# Patient Record
Sex: Male | Born: 1980 | Race: White | Hispanic: No | Marital: Single | Smoking: Current every day smoker
Health system: Southern US, Community
[De-identification: ages and names within clinical notes are randomized; demographics above are authoritative.]

## PROBLEM LIST (undated history)

## (undated) DIAGNOSIS — Z86010 Personal history of colonic polyps: Secondary | ICD-10-CM

## (undated) DIAGNOSIS — I10 Essential (primary) hypertension: Secondary | ICD-10-CM

## (undated) DIAGNOSIS — F419 Anxiety disorder, unspecified: Secondary | ICD-10-CM

## (undated) DIAGNOSIS — F4310 Post-traumatic stress disorder, unspecified: Secondary | ICD-10-CM

## (undated) HISTORY — PX: COLONOSCOPY W/ BIOPSIES AND POLYPECTOMY: SHX1376

## (undated) HISTORY — PX: MYRINGOTOMY: SHX2060

---

## 2003-01-05 ENCOUNTER — Emergency Department (HOSPITAL_COMMUNITY): Admission: EM | Admit: 2003-01-05 | Discharge: 2003-01-05 | Payer: Self-pay | Admitting: Emergency Medicine

## 2005-07-23 ENCOUNTER — Emergency Department (HOSPITAL_COMMUNITY): Admission: EM | Admit: 2005-07-23 | Discharge: 2005-07-23 | Payer: Self-pay | Admitting: Emergency Medicine

## 2005-09-30 ENCOUNTER — Emergency Department (HOSPITAL_COMMUNITY): Admission: EM | Admit: 2005-09-30 | Discharge: 2005-09-30 | Payer: Self-pay | Admitting: Emergency Medicine

## 2006-05-13 ENCOUNTER — Emergency Department: Payer: Self-pay | Admitting: Emergency Medicine

## 2008-04-21 ENCOUNTER — Emergency Department (HOSPITAL_COMMUNITY): Admission: EM | Admit: 2008-04-21 | Discharge: 2008-04-21 | Payer: Self-pay | Admitting: Emergency Medicine

## 2008-04-23 ENCOUNTER — Emergency Department (HOSPITAL_COMMUNITY): Admission: EM | Admit: 2008-04-23 | Discharge: 2008-04-23 | Payer: Self-pay | Admitting: Emergency Medicine

## 2011-09-02 ENCOUNTER — Emergency Department (HOSPITAL_COMMUNITY)
Admission: EM | Admit: 2011-09-02 | Discharge: 2011-09-02 | Disposition: A | Discharge status: Home / Self Care | Payer: Self-pay | Attending: Emergency Medicine | Admitting: Emergency Medicine

## 2011-09-02 DIAGNOSIS — Z79899 Other long term (current) drug therapy: Secondary | ICD-10-CM | POA: Insufficient documentation

## 2011-09-02 DIAGNOSIS — F3289 Other specified depressive episodes: Secondary | ICD-10-CM | POA: Insufficient documentation

## 2011-09-02 DIAGNOSIS — F329 Major depressive disorder, single episode, unspecified: Secondary | ICD-10-CM | POA: Insufficient documentation

## 2011-09-02 DIAGNOSIS — Y93H9 Activity, other involving exterior property and land maintenance, building and construction: Secondary | ICD-10-CM | POA: Insufficient documentation

## 2011-09-02 DIAGNOSIS — S0990XA Unspecified injury of head, initial encounter: Secondary | ICD-10-CM | POA: Insufficient documentation

## 2011-09-02 DIAGNOSIS — S0180XA Unspecified open wound of other part of head, initial encounter: Secondary | ICD-10-CM | POA: Insufficient documentation

## 2011-09-02 DIAGNOSIS — R51 Headache: Secondary | ICD-10-CM | POA: Insufficient documentation

## 2011-09-02 DIAGNOSIS — IMO0002 Reserved for concepts with insufficient information to code with codable children: Secondary | ICD-10-CM | POA: Insufficient documentation

## 2011-11-11 ENCOUNTER — Encounter: Payer: Self-pay | Admitting: Emergency Medicine

## 2011-11-11 ENCOUNTER — Inpatient Hospital Stay (HOSPITAL_COMMUNITY)
Admission: EM | Admit: 2011-11-11 | Discharge: 2011-11-15 | DRG: 439 | Disposition: A | Discharge status: Home / Self Care | Payer: Non-veteran care | Attending: Internal Medicine | Admitting: Internal Medicine

## 2011-11-11 ENCOUNTER — Emergency Department (HOSPITAL_COMMUNITY): Payer: Non-veteran care

## 2011-11-11 DIAGNOSIS — R7401 Elevation of levels of liver transaminase levels: Secondary | ICD-10-CM | POA: Diagnosis present

## 2011-11-11 DIAGNOSIS — I10 Essential (primary) hypertension: Secondary | ICD-10-CM | POA: Diagnosis present

## 2011-11-11 DIAGNOSIS — K859 Acute pancreatitis without necrosis or infection, unspecified: Principal | ICD-10-CM | POA: Diagnosis present

## 2011-11-11 DIAGNOSIS — F1010 Alcohol abuse, uncomplicated: Secondary | ICD-10-CM | POA: Diagnosis present

## 2011-11-11 DIAGNOSIS — F10939 Alcohol use, unspecified with withdrawal, unspecified: Secondary | ICD-10-CM | POA: Diagnosis present

## 2011-11-11 DIAGNOSIS — F17200 Nicotine dependence, unspecified, uncomplicated: Secondary | ICD-10-CM | POA: Diagnosis present

## 2011-11-11 DIAGNOSIS — E781 Pure hyperglyceridemia: Secondary | ICD-10-CM | POA: Diagnosis present

## 2011-11-11 DIAGNOSIS — F1020 Alcohol dependence, uncomplicated: Secondary | ICD-10-CM | POA: Diagnosis present

## 2011-11-11 DIAGNOSIS — R112 Nausea with vomiting, unspecified: Secondary | ICD-10-CM | POA: Diagnosis present

## 2011-11-11 DIAGNOSIS — D72829 Elevated white blood cell count, unspecified: Secondary | ICD-10-CM | POA: Diagnosis present

## 2011-11-11 DIAGNOSIS — F10239 Alcohol dependence with withdrawal, unspecified: Secondary | ICD-10-CM | POA: Diagnosis present

## 2011-11-11 DIAGNOSIS — F10929 Alcohol use, unspecified with intoxication, unspecified: Secondary | ICD-10-CM

## 2011-11-11 DIAGNOSIS — R7402 Elevation of levels of lactic acid dehydrogenase (LDH): Secondary | ICD-10-CM | POA: Diagnosis present

## 2011-11-11 DIAGNOSIS — Z860100 Personal history of colon polyps, unspecified: Secondary | ICD-10-CM

## 2011-11-11 DIAGNOSIS — F4310 Post-traumatic stress disorder, unspecified: Secondary | ICD-10-CM | POA: Diagnosis present

## 2011-11-11 DIAGNOSIS — R1013 Epigastric pain: Secondary | ICD-10-CM | POA: Diagnosis present

## 2011-11-11 HISTORY — DX: Anxiety disorder, unspecified: F41.9

## 2011-11-11 HISTORY — DX: Essential (primary) hypertension: I10

## 2011-11-11 HISTORY — DX: Post-traumatic stress disorder, unspecified: F43.10

## 2011-11-11 HISTORY — DX: Personal history of colonic polyps: Z86.010

## 2011-11-11 LAB — CBC
HCT: 46.8 % (ref 39.0–52.0)
MCH: 31.6 pg (ref 26.0–34.0)
MCV: 89.1 fL (ref 78.0–100.0)
Platelets: 211 10*3/uL (ref 150–400)
RDW: 14.2 % (ref 11.5–15.5)

## 2011-11-11 LAB — URINALYSIS, ROUTINE W REFLEX MICROSCOPIC
Glucose, UA: NEGATIVE mg/dL
Ketones, ur: NEGATIVE mg/dL
Leukocytes, UA: NEGATIVE
Nitrite: NEGATIVE
Protein, ur: NEGATIVE mg/dL
Urobilinogen, UA: 1 mg/dL (ref 0.0–1.0)

## 2011-11-11 LAB — LIPID PANEL
HDL: 11 mg/dL — ABNORMAL LOW (ref >39)
Total CHOL/HDL Ratio: 14.6 RATIO
VLDL: UNDETERMINED mg/dL (ref 0–40)

## 2011-11-11 LAB — COMPREHENSIVE METABOLIC PANEL
ALT: 104 U/L — ABNORMAL HIGH (ref 0–53)
AST: 289 U/L — ABNORMAL HIGH (ref 0–37)
Albumin: 3.7 g/dL (ref 3.5–5.2)
CO2: 21 mEq/L (ref 19–32)
Chloride: 91 mEq/L — ABNORMAL LOW (ref 96–112)
GFR calc non Af Amer: >90 mL/min (ref >90)
Potassium: 5.3 mEq/L — ABNORMAL HIGH (ref 3.5–5.1)
Sodium: 128 mEq/L — ABNORMAL LOW (ref 135–145)
Total Bilirubin: 0.4 mg/dL (ref 0.3–1.2)

## 2011-11-11 LAB — MAGNESIUM: Magnesium: 1.5 mg/dL (ref 1.5–2.5)

## 2011-11-11 LAB — ETHANOL: Alcohol, Ethyl (B): 176 mg/dL — ABNORMAL HIGH (ref 0–11)

## 2011-11-11 LAB — POTASSIUM: Potassium: 4.3 mEq/L (ref 3.5–5.1)

## 2011-11-11 LAB — DIFFERENTIAL
Basophils Absolute: 0.1 10*3/uL (ref 0.0–0.1)
Eosinophils Absolute: 0 10*3/uL (ref 0.0–0.7)
Eosinophils Relative: 0 % (ref 0–5)
Lymphocytes Relative: 9 % — ABNORMAL LOW (ref 12–46)
Lymphs Abs: 2.5 10*3/uL (ref 0.7–4.0)
Monocytes Absolute: 2.7 10*3/uL — ABNORMAL HIGH (ref 0.1–1.0)

## 2011-11-11 LAB — RAPID URINE DRUG SCREEN, HOSP PERFORMED
Amphetamines: NOT DETECTED
Opiates: NOT DETECTED
Tetrahydrocannabinol: NOT DETECTED

## 2011-11-11 LAB — TSH: TSH: 0.294 u[IU]/mL — ABNORMAL LOW (ref 0.350–4.500)

## 2011-11-11 MED ORDER — LISINOPRIL 10 MG PO TABS
10.0000 mg | ORAL_TABLET | Freq: Every day | ORAL | Status: DC
  Administered 2011-11-11 – 2011-11-13 (×3): 10 mg via ORAL
  Filled 2011-11-11 (×4): qty 1

## 2011-11-11 MED ORDER — PANTOPRAZOLE SODIUM 40 MG IV SOLR
40.0000 mg | Freq: Every day | INTRAVENOUS | Status: DC
  Administered 2011-11-11 – 2011-11-14 (×4): 40 mg via INTRAVENOUS
  Filled 2011-11-11 (×5): qty 40

## 2011-11-11 MED ORDER — FENTANYL CITRATE 0.05 MG/ML IJ SOLN
100.0000 ug | Freq: Once | INTRAMUSCULAR | Status: AC
  Administered 2011-11-11: 100 ug via INTRAVENOUS
  Filled 2011-11-11: qty 2

## 2011-11-11 MED ORDER — SODIUM CHLORIDE 0.9 % IV BOLUS (SEPSIS)
1000.0000 mL | Freq: Once | INTRAVENOUS | Status: AC
  Administered 2011-11-11: 1000 mL via INTRAVENOUS

## 2011-11-11 MED ORDER — SODIUM CHLORIDE 0.9 % IV SOLN
INTRAVENOUS | Status: DC
  Administered 2011-11-11 – 2011-11-12 (×4): via INTRAVENOUS

## 2011-11-11 MED ORDER — ONDANSETRON HCL 4 MG/2ML IJ SOLN
4.0000 mg | Freq: Once | INTRAMUSCULAR | Status: AC
  Administered 2011-11-11: 4 mg via INTRAVENOUS
  Filled 2011-11-11: qty 2

## 2011-11-11 MED ORDER — HYDROMORPHONE HCL PF 1 MG/ML IJ SOLN: 1.0000 mg | Freq: Once | INTRAMUSCULAR | Status: DC

## 2011-11-11 MED ORDER — THERA M PLUS PO TABS
1.0000 | ORAL_TABLET | Freq: Every day | ORAL | Status: DC
  Administered 2011-11-11: 1 via ORAL
  Administered 2011-11-12 – 2011-11-13 (×2): via ORAL
  Administered 2011-11-14 – 2011-11-15 (×2): 1 via ORAL
  Filled 2011-11-11 (×5): qty 1

## 2011-11-11 MED ORDER — VITAMIN B-1 100 MG PO TABS
100.0000 mg | ORAL_TABLET | Freq: Every day | ORAL | Status: DC
  Administered 2011-11-11 – 2011-11-15 (×5): 100 mg via ORAL
  Filled 2011-11-11 (×5): qty 1

## 2011-11-11 MED ORDER — HYDROMORPHONE HCL PF 2 MG/ML IJ SOLN
INTRAMUSCULAR | Status: AC
  Administered 2011-11-11: 2 mg via INTRAVENOUS
  Filled 2011-11-11: qty 1

## 2011-11-11 MED ORDER — PRAZOSIN HCL 2 MG PO CAPS
2.0000 mg | ORAL_CAPSULE | Freq: Every day | ORAL | Status: DC
  Administered 2011-11-11 – 2011-11-14 (×4): 2 mg via ORAL
  Filled 2011-11-11 (×5): qty 1

## 2011-11-11 MED ORDER — SODIUM CHLORIDE 0.9 % IV SOLN
500.0000 mg | Freq: Four times a day (QID) | INTRAVENOUS | Status: DC
  Administered 2011-11-11 – 2011-11-14 (×11): 500 mg via INTRAVENOUS
  Administered 2011-11-14: 07:00:00 via INTRAVENOUS
  Administered 2011-11-14: 500 mg via INTRAVENOUS
  Filled 2011-11-11 (×15): qty 500

## 2011-11-11 MED ORDER — THIAMINE HCL 100 MG/ML IJ SOLN
100.0000 mg | Freq: Every day | INTRAMUSCULAR | Status: DC
  Administered 2011-11-13: 100 mg via INTRAVENOUS
  Filled 2011-11-11 (×5): qty 1

## 2011-11-11 MED ORDER — SERTRALINE HCL 100 MG PO TABS
100.0000 mg | ORAL_TABLET | Freq: Every day | ORAL | Status: DC
  Administered 2011-11-11 – 2011-11-15 (×5): 100 mg via ORAL
  Filled 2011-11-11 (×5): qty 1

## 2011-11-11 MED ORDER — LORAZEPAM 2 MG/ML IJ SOLN
1.0000 mg | Freq: Four times a day (QID) | INTRAMUSCULAR | Status: AC | PRN
  Administered 2011-11-12: 2 mg via INTRAVENOUS
  Administered 2011-11-13: 1 mg via INTRAVENOUS
  Administered 2011-11-13: 23:00:00 via INTRAVENOUS
  Administered 2011-11-13: 1 mg via INTRAVENOUS

## 2011-11-11 MED ORDER — ONDANSETRON HCL 4 MG/2ML IJ SOLN: 4.0000 mg | Freq: Four times a day (QID) | INTRAMUSCULAR | Status: DC | PRN

## 2011-11-11 MED ORDER — ONDANSETRON HCL 4 MG PO TABS: 4.0000 mg | ORAL_TABLET | Freq: Four times a day (QID) | ORAL | Status: DC | PRN

## 2011-11-11 MED ORDER — SODIUM CHLORIDE 0.9 % IV SOLN
500.0000 mg | INTRAVENOUS | Status: AC
  Administered 2011-11-11: 500 mg via INTRAVENOUS
  Filled 2011-11-11: qty 500

## 2011-11-11 MED ORDER — HYDROMORPHONE HCL PF 2 MG/ML IJ SOLN
INTRAMUSCULAR | Status: AC
  Administered 2011-11-11: 1 mg via INTRAVENOUS
  Filled 2011-11-11: qty 1

## 2011-11-11 MED ORDER — LORAZEPAM 1 MG PO TABS
0.0000 mg | ORAL_TABLET | Freq: Two times a day (BID) | ORAL | Status: DC
  Administered 2011-11-13 – 2011-11-14 (×2): 1 mg via ORAL
  Filled 2011-11-11: qty 1

## 2011-11-11 MED ORDER — FOLIC ACID 1 MG PO TABS
1.0000 mg | ORAL_TABLET | Freq: Every day | ORAL | Status: DC
  Administered 2011-11-11 – 2011-11-15 (×5): 1 mg via ORAL
  Filled 2011-11-11 (×5): qty 1

## 2011-11-11 MED ORDER — LORAZEPAM 1 MG PO TABS
0.0000 mg | ORAL_TABLET | Freq: Four times a day (QID) | ORAL | Status: AC
  Administered 2011-11-12 – 2011-11-13 (×7): 1 mg via ORAL
  Filled 2011-11-11 (×3): qty 1

## 2011-11-11 MED ORDER — ENOXAPARIN SODIUM 40 MG/0.4ML ~~LOC~~ SOLN
40.0000 mg | SUBCUTANEOUS | Status: DC
  Administered 2011-11-11 – 2011-11-14 (×4): 40 mg via SUBCUTANEOUS
  Filled 2011-11-11 (×5): qty 0.4

## 2011-11-11 MED ORDER — LORAZEPAM 1 MG PO TABS
1.0000 mg | ORAL_TABLET | Freq: Four times a day (QID) | ORAL | Status: AC | PRN
  Filled 2011-11-11 (×2): qty 1

## 2011-11-11 MED ORDER — HYDROMORPHONE HCL PF 1 MG/ML IJ SOLN
2.0000 mg | INTRAMUSCULAR | Status: DC | PRN
  Administered 2011-11-11 – 2011-11-13 (×7): 2 mg via INTRAVENOUS
  Filled 2011-11-11 (×11): qty 2

## 2011-11-11 MED ORDER — LORAZEPAM 2 MG/ML IJ SOLN
0.5000 mg | INTRAMUSCULAR | Status: DC | PRN
  Administered 2011-11-11 – 2011-11-12 (×4): 0.5 mg via INTRAVENOUS
  Filled 2011-11-11 (×8): qty 1

## 2011-11-11 NOTE — ED Notes (Signed)
Report given and getting ready to transport pt to floor but, he is c/o severe generalized abdominal pain returning--Dilaudid 2 mg given IVP as ordered.

## 2011-11-11 NOTE — H&P (Signed)
Roy Byrd MRN: [id number] DOB/AGE: [birth year] 29 y.o. Primary Care Physician:No primary provider on file. Admit date: 11/11/2011  PCP [facility] Chief Complaint: Epigastric pain  HPI :  30 year old male who presents with epigastric pain for 2 days The primary symptoms of the illness include abdominal pain, nausea and vomiting. The patient admits to drinking on a regular basis. No prior episodes of pancreatitis. 2 days ago he started developing epigastric pain associated with nausea and multiple episodes of vomiting. He states that he has had lower GI bleeding and has had a colonoscopy in the past, no obvious cause of his bleeding was found. No history of any abdominal surgeries..  patient complains of abdominal pain acute in onset 2 days ago. The pain is located in the epigastric region with radiation to the back. It is described as stabbing and is severe. It is gradually worsening. Movement and palpation make the pain worse, nothing makes the pain better. It associated with fever, chills.. There is no change in the patients bowel function. The patient denies any history of pancreatitis though he does drink alcohol frequently. He has tried Aleve for the pain with no relief.      Past Medical History  Diagnosis Date  . Hypertension   . PTSD (post-traumatic stress disorder)   . Anxiety   . Hx of colonic polyps     Past Surgical History  Procedure Date  . Colonoscopy w/ biopsies and polypectomy   . Myringotomy     Prior to Admission medications   Medication Sig Start Date End Date Taking? Authorizing Provider  lisinopril (PRINIVIL,ZESTRIL) 10 MG tablet Take 10 mg by mouth daily.     Yes Historical Provider, MD  prazosin (MINIPRESS) 2 MG capsule Take 2 mg by mouth at bedtime.     Yes Historical Provider, MD  sertraline (ZOLOFT) 100 MG tablet Take 100 mg by mouth daily.     Yes Historical Provider, MD    Allergies:  Allergies  Allergen Reactions  . Percocet  (Oxycodone-Acetaminophen)     Family history. Father has hypertension has had 4 strokes, has peripheral vascular disease Social history the patient is a retired Investment banker, operational, he drinks about 3-4 beers per day, father indicates that he drinks much more than that, he smokes one to 2 packs a day.      ROS: A complete 14 point review of systems was done and documented in HBS  PHYSICAL EXAM: General: Alert, awake, oriented x3, in no acute distress. HEENT: No bruits, no goiter. Heart: Regular rate and rhythm, without murmurs, rubs, gallops. Lungs: Clear to auscultation bilaterally. Abdomen: Soft, epigastric tenderness, nondistended, positive bowel sounds. Extremities: No clubbing cyanosis or edema with positive pedal pulses. Neuro: Grossly intact, nonfocal.  Psychiatric irritable and anxious  skin without any skin rashes    No results found for this or any previous visit (from the past 240 hour(s)).   Results for orders placed during the hospital encounter of 11/11/11 (from the past 48 hour(s))  COMPREHENSIVE METABOLIC PANEL     Status: Abnormal   Collection Time   11/11/11  6:20 AM      Component Value Range Comment   Sodium 128 (*) 135 - 145 (mEq/L)    Potassium 5.3 (*) 3.5 - 5.1 (mEq/L) MODERATE HEMOLYSIS   Chloride 91 (*) 96 - 112 (mEq/L)    CO2 21  19 - 32 (mEq/L)    Glucose, Bld 90  70 - 99 (mg/dL)    BUN 25 (*)  6 - 23 (mg/dL)    Creatinine, Ser 0.45  0.50 - 1.35 (mg/dL)    Calcium 8.8  8.4 - 10.5 (mg/dL)    Total Protein 6.9  6.0 - 8.3 (g/dL)    Albumin 3.7  3.5 - 5.2 (g/dL)    AST 409 (*) 0 - 37 (U/L) MODERATE HEMOLYSIS   ALT 104 (*) 0 - 53 (U/L) MODERATE HEMOLYSIS   Alkaline Phosphatase 106  39 - 117 (U/L) MODERATE HEMOLYSIS   Total Bilirubin 0.4  0.3 - 1.2 (mg/dL)    GFR calc non Af Amer >90  >90 (mL/min)    GFR calc Af Amer >90  >90 (mL/min)   LIPASE, BLOOD     Status: Abnormal   Collection Time   11/11/11  6:20 AM      Component Value Range Comment   Lipase  194 (*) 11 - 59 (U/L)   CBC     Status: Abnormal   Collection Time   11/11/11  6:20 AM      Component Value Range Comment   WBC 29.2 (*) 4.0 - 10.5 (K/uL)    RBC 5.25  4.22 - 5.81 (MIL/uL)    Hemoglobin 16.6  13.0 - 17.0 (g/dL)    HCT 81.1  91.4 - 78.2 (%)    MCV 89.1  78.0 - 100.0 (fL)    MCH 31.6  26.0 - 34.0 (pg)    MCHC 35.5  30.0 - 36.0 (g/dL)    RDW 95.6  21.3 - 08.6 (%)    Platelets 211  150 - 400 (K/uL)   DIFFERENTIAL     Status: Abnormal   Collection Time   11/11/11  6:20 AM      Component Value Range Comment   Neutrophils Relative 82 (*) 43 - 77 (%)    Neutro Abs 24.0 (*) 1.7 - 7.7 (K/uL)    Lymphocytes Relative 9 (*) 12 - 46 (%)    Lymphs Abs 2.5  0.7 - 4.0 (K/uL)    Monocytes Relative 9  3 - 12 (%)    Monocytes Absolute 2.7 (*) 0.1 - 1.0 (K/uL)    Eosinophils Relative 0  0 - 5 (%)    Eosinophils Absolute 0.0  0.0 - 0.7 (K/uL)    Basophils Relative 0  0 - 1 (%)    Basophils Absolute 0.1  0.0 - 0.1 (K/uL)   ETHANOL     Status: Abnormal   Collection Time   11/11/11  6:20 AM      Component Value Range Comment   Alcohol, Ethyl (B) 176 (*) 0 - 11 (mg/dL)   URINE RAPID DRUG SCREEN (HOSP PERFORMED)     Status: Normal   Collection Time   11/11/11  7:58 AM      Component Value Range Comment   Opiates NONE DETECTED  NONE DETECTED     Cocaine NONE DETECTED  NONE DETECTED     Benzodiazepines NONE DETECTED  NONE DETECTED     Amphetamines NONE DETECTED  NONE DETECTED     Tetrahydrocannabinol NONE DETECTED  NONE DETECTED     Barbiturates NONE DETECTED  NONE DETECTED    URINALYSIS, ROUTINE W REFLEX MICROSCOPIC     Status: Normal   Collection Time   11/11/11  7:58 AM      Component Value Range Comment   Color, Urine YELLOW  YELLOW     Appearance CLEAR  CLEAR     Specific Gravity, Urine 1.026  1.005 - 1.030  pH 7.0  5.0 - 8.0     Glucose, UA NEGATIVE  NEGATIVE (mg/dL)    Hgb urine dipstick NEGATIVE  NEGATIVE     Bilirubin Urine NEGATIVE  NEGATIVE     Ketones, ur  NEGATIVE  NEGATIVE (mg/dL)    Protein, ur NEGATIVE  NEGATIVE (mg/dL)    Urobilinogen, UA 1.0  0.0 - 1.0 (mg/dL)    Nitrite NEGATIVE  NEGATIVE     Leukocytes, UA NEGATIVE  NEGATIVE  MICROSCOPIC NOT DONE ON URINES WITH NEGATIVE PROTEIN, BLOOD, LEUKOCYTES, NITRITE, OR GLUCOSE <1000 mg/dL.  POTASSIUM     Status: Normal   Collection Time   11/11/11  8:00 AM      Component Value Range Comment   Potassium 4.3  3.5 - 5.1 (mEq/L)   MAGNESIUM     Status: Normal   Collection Time   11/11/11 10:36 AM      Component Value Range Comment   Magnesium 1.5  1.5 - 2.5 (mg/dL)   PHOSPHORUS     Status: Normal   Collection Time   11/11/11 10:36 AM      Component Value Range Comment   Phosphorus 2.8  2.3 - 4.6 (mg/dL)   PROTIME-INR     Status: Normal   Collection Time   11/11/11 10:36 AM      Component Value Range Comment   Prothrombin Time 13.8  11.6 - 15.2 (seconds)    INR 1.04  0.00 - 1.49      US Abdomen Complete  11/11/2011  *RADIOLOGY REPORT*   IMPRESSION: Normal examination.  Original Report Authenticated By: Arnell Sieving, M.D.   Dg Abd Acute W/chest  11/11/2011  *RADIOLOGY REPORT*  Clinical Data: Severe abdominal pain.  Elevated white blood cell count.  ACUTE ABDOMEN SERIES (ABDOMEN 2 VIEW & CHEST 1 VIEW)  Comparison: None.  Findings: No active cardiopulmonary disease.  No free air underneath the hemidiaphragms.  Lung volumes are slightly low.  The bowel gas pattern is nonobstructive.  No pathologic air fluid levels.  No dilation of large or small bowel.  IMPRESSION: Nonobstructive bowel gas pattern.  No acute abnormality.  Original Report Authenticated By: Andreas Newport, M.D.    Impression:   #1 acute pancreatitisHe has associated leukocytosis.   His alcohol level is elevated; although the patient denies frequent alcohol use, his girlfriend reports that he drinks heavily at least 3 times a week. There is also an elevation in his liver enzymes specifically his AST and ALT. This is  likely due to chronic alcohol use, but a gallstone cannot be ruled out. The patient's right upper quadrant ultrasound is negative for gallstones, the patient has persistent leukocytosis would recommend a CT scan of the abdomen and pelvis with contrast to further evaluate his leukocytosis and his pain #2 transaminitis consistent with alcoholic liver disease, monitor #3 alcohol dependence the patient will be monitored on CIWA protocol, will use when necessary Ativan  #4 leukocytosis likely due to stress margination vs secondary to intra-abdominal process. Empiric imipenem     Lael Pilch 11/11/2011, 12:01 PM

## 2011-11-11 NOTE — ED Notes (Signed)
B/P 133/84  HR 102--Again, appears to be getting very anxious--c/o severe epigastric pain--Medicated as ordered.  Tolerated well---Significant other at bedside.

## 2011-11-11 NOTE — ED Notes (Signed)
Report called to Jessica, RN

## 2011-11-11 NOTE — Progress Notes (Signed)
Started CIWA Protocol  [employer]

## 2011-11-11 NOTE — ED Notes (Signed)
Continues to have much decrease in epigastric pain--continues to rate pain a 4 on 1-10 scale--No further observed bouts of nausea or vomiting.  Significant other at bedside--Awaiting room assignment--Pt. Kept advised of status of delay

## 2011-11-11 NOTE — ED Notes (Signed)
WUJ:[id number]<JY> Expected date:11/11/11<BR> Expected time: 5:54 AM<BR> Means of arrival:Ambulance<BR> Comments:<BR> abd pain

## 2011-11-11 NOTE — ED Notes (Signed)
Ultrasound complete----Imipenem IVPB now infusing via patent IV line Right A/c---Continues to have much improvement in pain relief and is now rating his pain a 4 on 1-10 scale.

## 2011-11-11 NOTE — Progress Notes (Signed)
ANTIBIOTIC CONSULT NOTE - INITIAL  Pharmacy Consult for Primaxin Indication: Pancreatitis  Allergies  Allergen Reactions  . Percocet (Oxycodone-Acetaminophen)     Patient Measurements:   Ht 5'7" Wt 165 lbs (=75 kg) [Both Ht and Wt stated by patient / significant other]  Vital Signs: Temp: 98.7 F (37.1 C) (11/16 0744) Temp src: Oral (11/16 0744) BP: 147/82 mmHg (11/16 0744) Pulse Rate: 83  (11/16 0744)        Labs:  Basename 11/11/11 0620  WBC 29.2*  HGB 16.6  PLT 211  LABCREA --  CREATININE 0.87   CrCl > 157mL/min/1.73m2  Microbiology: No results found for this or any previous visit (from the past 720 hour(s)).  Medical History: Past Medical History  Diagnosis Date  . Hypertension   . PTSD (post-traumatic stress disorder)   . Anxiety   . Hx of colonic polyps     Medications:  Home Medications    Current Outpatient Rx   Name  Route  Sig  Dispense  Refill   .  LISINOPRIL 10 MG PO TABS  Oral  Take 10 mg by mouth daily.     Marland Kitchen  PRAZOSIN HCL 2 MG PO CAPS  Oral  Take 2 mg by mouth at bedtime.     .  SERTRALINE HCL 100 MG PO TABS  Oral  Take 100 mg by mouth daily.         Assessment: 30 y/o M with pancreatitis, r/o due to EtOH vs other etiology  Goal of Therapy:  Empiric Primaxin with dosage adjusted for weight and renal function.  Plan:  Primaxin 500mg  IV q6h Follow serum creatinine, any cultures, and clinical course.  Elie Goody, Pharm.D.  [phone] 11/11/2011 11:17 AM

## 2011-11-11 NOTE — ED Notes (Signed)
Pt to ED with abd pain all over x 2 days. Pt states pain has increasingly got worse today. Pt with intermittent n/v. Pt denies any diarrhea

## 2011-11-11 NOTE — ED Provider Notes (Signed)
History     CSN: [id number] Arrival date & time: 11/11/2011  6:22 AM   First MD Initiated Contact with Patient 11/11/11 [phone]      Chief Complaint  Patient presents with  . Abdominal Pain    (Consider location/radiation/quality/duration/timing/severity/associated sxs/prior treatment) Patient is a 30 y.o. male presenting with abdominal pain. The history is provided by the patient.  Abdominal Pain The primary symptoms of the illness include abdominal pain, nausea and vomiting. The primary symptoms of the illness do not include fever, shortness of breath, diarrhea or dysuria.  Additional symptoms associated with the illness include chills. Symptoms associated with the illness do not include constipation, hematuria or back pain. Significant associated medical issues do not include PUD, GERD, inflammatory bowel disease, gallstones, liver disease or diverticulitis. Associated medical issues comments: No history of any abdominal surgeries..   patient complains of abdominal pain acute in onset 2 days ago. The pain is located in the epigastric region with radiation to the back. It is described as stabbing and is severe. It is gradually worsening. Movement and palpation make the pain worse, nothing makes the pain better. It associated with chills, nausea, and vomiting. There is no change in the patients bowel function. The patient denies any history of pancreatitis though he does drink alcohol frequently. He has tried Aleve for the pain with no relief.   Past Medical History  Diagnosis Date  . Hypertension   . PTSD (post-traumatic stress disorder)     No past surgical history on file.  No family history on file.  History  Substance Use Topics  . Smoking status: Not on file  . Smokeless tobacco: Not on file  . Alcohol Use:       Review of Systems  Constitutional: Positive for chills. Negative for fever.  HENT: Negative for congestion, neck pain and neck stiffness.   Eyes: Negative for  pain and visual disturbance.  Respiratory: Negative for cough, chest tightness and shortness of breath.   Cardiovascular: Negative for chest pain, palpitations and leg swelling.  Gastrointestinal: Positive for nausea, vomiting and abdominal pain. Negative for diarrhea, constipation and abdominal distention.       Positive blood in the stool which is chronic for patient and has been evaluated by his primary doctor  Genitourinary: Negative for dysuria, hematuria and flank pain.  Musculoskeletal: Negative for back pain, joint swelling and gait problem.  Skin: Negative for rash and wound.  Neurological: Negative for dizziness, seizures, weakness, numbness and headaches.  Hematological: Does not bruise/bleed easily.  Psychiatric/Behavioral: Negative for behavioral problems and confusion.    Allergies  Percocet  Home Medications   Current Outpatient Rx  Name Route Sig Dispense Refill  . LISINOPRIL 10 MG PO TABS Oral Take 10 mg by mouth daily.      Marland Kitchen PRAZOSIN HCL 2 MG PO CAPS Oral Take 2 mg by mouth at bedtime.      . SERTRALINE HCL 100 MG PO TABS Oral Take 100 mg by mouth daily.        BP 141/62  Pulse 99  Temp(Src) 97.6 F (36.4 C) (Oral)  Resp 16  SpO2 100%  Physical Exam  Constitutional: He is oriented to person, place, and time. He appears well-developed and well-nourished. He appears distressed.  HENT:  Head: Normocephalic and atraumatic.  Right Ear: External ear normal.  Left Ear: External ear normal.       Mucous membranes dry.  Neck: Normal range of motion. Neck supple.  Cardiovascular: Normal rate,  regular rhythm and normal heart sounds.   Pulmonary/Chest: Effort normal and breath sounds normal. No respiratory distress. He exhibits no tenderness.  Abdominal: Soft. Bowel sounds are normal. He exhibits no distension and no mass. There is no rigidity, no rebound, no CVA tenderness and negative Murphy's sign.       Significant tenderness to palpation in the epigastric region  with guarding to palpation in this area. Palpation of other areas of the abdomen measuring referred tenderness to the epigastric region.  Musculoskeletal: Normal range of motion. He exhibits no edema and no tenderness.  Lymphadenopathy:    He has no cervical adenopathy.  Neurological: He is alert and oriented to person, place, and time. No cranial nerve deficit. Coordination normal.  Skin: Skin is warm and dry. No rash noted.  Psychiatric: He has a normal mood and affect.    ED Course  Procedures (including critical care time)  Labs Reviewed  COMPREHENSIVE METABOLIC PANEL - Abnormal; Notable for the following:    Sodium 128 (*)    Potassium 5.3 (*) MODERATE HEMOLYSIS   Chloride 91 (*)    BUN 25 (*)    AST 289 (*) MODERATE HEMOLYSIS   ALT 104 (*) MODERATE HEMOLYSIS   All other components within normal limits  LIPASE, BLOOD - Abnormal; Notable for the following:    Lipase 194 (*)    All other components within normal limits  CBC - Abnormal; Notable for the following:    WBC 29.2 (*)    All other components within normal limits  DIFFERENTIAL - Abnormal; Notable for the following:    Neutrophils Relative 82 (*)    Neutro Abs 24.0 (*)    Lymphocytes Relative 9 (*)    Monocytes Absolute 2.7 (*)    All other components within normal limits  ETHANOL - Abnormal; Notable for the following:    Alcohol, Ethyl (B) 176 (*)    All other components within normal limits  URINE RAPID DRUG SCREEN (HOSP PERFORMED)  URINALYSIS, ROUTINE W REFLEX MICROSCOPIC  POTASSIUM  POTASSIUM   Dg Abd Acute W/chest  11/11/2011  *RADIOLOGY REPORT*  Clinical Data: Severe abdominal pain.  Elevated white blood cell count.  ACUTE ABDOMEN SERIES (ABDOMEN 2 VIEW & CHEST 1 VIEW)  Comparison: None.  Findings: No active cardiopulmonary disease.  No free air underneath the hemidiaphragms.  Lung volumes are slightly low.  The bowel gas pattern is nonobstructive.  No pathologic air fluid levels.  No dilation of large or  small bowel.  IMPRESSION: Nonobstructive bowel gas pattern.  No acute abnormality.  Original Report Authenticated By: Andreas Newport, M.D.     1. Pancreatitis   2. Alcohol intoxication       MDM  30 year old male with 2 days of abdominal pain that appears to be secondary to pancreatitis. He has associated leukocytosis. He has some hyponatremia and hyperkalemia; the laboratory reading reported as hemolysis however, so I have ordered a repeat potassium level. His alcohol level is elevated; although the patient denies frequent alcohol use, his girlfriend reports that he drinks heavily at least 3 times a week. There is also an elevation in his liver enzymes specifically his AST and ALT. This is likely due to chronic alcohol use, but a gallstone cannot be ruled out. I have discussed this patient's care with the admitting doctor with the [facility] hospitalist, and she has agreed to see the patient in the emergency department and will place further orders.        Judeth Cornfield Justice  Artis Flock, [place] 11/11/11 [phone]

## 2011-11-12 ENCOUNTER — Inpatient Hospital Stay (HOSPITAL_COMMUNITY): Payer: Non-veteran care

## 2011-11-12 DIAGNOSIS — D72829 Elevated white blood cell count, unspecified: Secondary | ICD-10-CM

## 2011-11-12 DIAGNOSIS — K859 Acute pancreatitis without necrosis or infection, unspecified: Secondary | ICD-10-CM

## 2011-11-12 DIAGNOSIS — F10929 Alcohol use, unspecified with intoxication, unspecified: Secondary | ICD-10-CM

## 2011-11-12 LAB — COMPREHENSIVE METABOLIC PANEL
ALT: 60 U/L — ABNORMAL HIGH (ref 0–53)
ALT: 61 U/L — ABNORMAL HIGH (ref 0–53)
Albumin: 3 g/dL — ABNORMAL LOW (ref 3.5–5.2)
Alkaline Phosphatase: 84 U/L (ref 39–117)
Alkaline Phosphatase: 93 U/L (ref 39–117)
CO2: 24 mEq/L (ref 19–32)
CO2: 26 mEq/L (ref 19–32)
Chloride: 101 mEq/L (ref 96–112)
GFR calc Af Amer: >90 mL/min (ref >90)
GFR calc Af Amer: >90 mL/min (ref >90)
GFR calc non Af Amer: >90 mL/min (ref >90)
Glucose, Bld: 102 mg/dL — ABNORMAL HIGH (ref 70–99)
Potassium: 3.8 mEq/L (ref 3.5–5.1)
Sodium: 133 mEq/L — ABNORMAL LOW (ref 135–145)
Total Bilirubin: 0.6 mg/dL (ref 0.3–1.2)
Total Protein: 6.1 g/dL (ref 6.0–8.3)

## 2011-11-12 LAB — CBC
MCH: 31.1 pg (ref 26.0–34.0)
MCV: 92.1 fL (ref 78.0–100.0)
Platelets: 145 10*3/uL — ABNORMAL LOW (ref 150–400)
RDW: 14.6 % (ref 11.5–15.5)

## 2011-11-12 MED ORDER — GEMFIBROZIL 600 MG PO TABS
600.0000 mg | ORAL_TABLET | Freq: Two times a day (BID) | ORAL | Status: DC
  Administered 2011-11-12 – 2011-11-15 (×6): 600 mg via ORAL
  Filled 2011-11-12 (×8): qty 1

## 2011-11-12 MED ORDER — NICOTINE 21 MG/24HR TD PT24
21.0000 mg | MEDICATED_PATCH | Freq: Every day | TRANSDERMAL | Status: DC
  Administered 2011-11-12 – 2011-11-15 (×4): 21 mg via TRANSDERMAL
  Filled 2011-11-12 (×5): qty 1

## 2011-11-12 MED ORDER — IOHEXOL 300 MG/ML  SOLN
100.0000 mL | Freq: Once | INTRAMUSCULAR | Status: AC | PRN
  Administered 2011-11-12: 100 mL via INTRAVENOUS

## 2011-11-12 NOTE — Progress Notes (Signed)
Subjective: Patient is feeling a little better today. His alcohol withdrawal is controlled.  Objective: Vital signs in last 24 hours: Temp:  [97.5 F (36.4 C)-98.4 F (36.9 C)] 98.4 F (36.9 C) (11/17 1159) Pulse Rate:  [74-89] 74  (11/17 1159) Resp:  [18-20] 18  (11/17 1159) BP: (128-153)/(79-95) 142/87 mmHg (11/17 1159) SpO2:  [96 %-98 %] 96 % (11/17 1159) Weight change:   -Intake/Output from previous day: 11/16 0701 - 11/17 0700 In: 1900 [I.V.:1800; IV Piggyback:100] Out: 875 [Urine:875]  Blood pressure 142/87, pulse 74, temperature 98.4 F (36.9 C), temperature source Oral, resp. rate 18, height 5\' 7"  (1.702 m), weight 77.6 kg (171 lb 1.2 oz), SpO2 96.00%.   General: Patient appears his stated age. HEENT: Head normocephalic. Cardiovascular: Regular rate rhythm. Lungs: Clear to auscultation bilaterally. Abdomen: Soft diffusely tender positive bowel sounds. Extremities: No edema Lab Results: Lab Results  Component Value Date   WBC 22.8* 11/12/2011   HGB 14.2 11/12/2011   HCT 42.0 11/12/2011   MCV 92.1 11/12/2011   PLT 145* 11/12/2011    BMET  Lab 11/12/11 1225  NA 136  K 4.5  CL 103  CO2 26  BUN 8  CREATININE 0.81  LABGLOM --  GLUCOSE 89  CALCIUM 8.4    Studies/Results: US Abdomen Complete  11/11/2011  *RADIOLOGY REPORT*  Clinical Data:  Unexplained abdominal pain.  COMPLETE ABDOMINAL ULTRASOUND 11/11/2011:  Comparison:  Acute abdomen series same date.  No prior ultrasound.  Findings:  Gallbladder:  No shadowing gallstones or echogenic sludge.  No gallbladder wall thickening or pericholecystic fluid.  Negative sonographic Murphy's sign according to the ultrasound technologist.  Common bile duct:  Normal in caliber with maximum diameter approximating 4 mm.  Liver:  Normal size and echotexture without focal parenchymal abnormality.  Patent portal vein with hepatopetal flow.  IVC:  Patent.  Pancreas:  Although the pancreas is difficult to visualize in its  entirety, no focal pancreatic abnormality is identified.  Spleen:  Normal size and echotexture without focal parenchymal abnormality.  Right Kidney:  No hydronephrosis.  Well-preserved cortex.  No shadowing calculi.  Normal size and parenchymal echotexture without focal abnormalities.  Approximately 11.1 cm in length.  Left Kidney:  No hydronephrosis.  Well-preserved cortex.  No shadowing calculi.  Normal size and parenchymal echotexture without focal abnormalities.  Approximately 10.6 cm in length.  Abdominal aorta:  Normal in caliber throughout its visualized course in the abdomen without significant atherosclerosis.  IMPRESSION: Normal examination.  Original Report Authenticated By: Arnell Sieving, M.D.   Ct Abdomen Pelvis W Contrast  11/12/2011  *RADIOLOGY REPORT*  Clinical Data: Epigastric abdominal pain.  Pancreatitis.  CT ABDOMEN AND PELVIS WITH CONTRAST  Technique:  Multidetector CT imaging of the abdomen and pelvis was performed following the standard protocol during bolus administration of intravenous contrast.  Contrast: OMNIPAQUE IOHEXOL 300 MG/ML IV SOLN  Comparison: Abdominal ultrasound 11/11/2011. No similar prior study is available for comparison.  Findings: Linear left lower lobe scarring or atelectasis noted. Lung bases otherwise clear.  Borderline fatty liver.  No focal abnormality.  3 mm nonobstructing right lower renal pole calculus identified on image 44.  2 mm right upper renal pole nonobstructing calculus image 36.  No radiopaque left renal or bilateral ureteral calculus.  The spleen, adrenal glands, and gallbladder are normal.  There is heterogeneous hypo enhancement of the pancreatic tail, image 24, with peripancreatic fluid and stranding surrounding the distal body and tail.  Fluid tracks inferiorly to the pelvis.  No focal measurable peripancreatic collection is identified to suggest pseudocyst formation.  No pancreatic ductal dilatation.  No extrahepatic ductal dilatation.   Low density fluid tracks inferiorly to the pelvis.  The normal- appearing appendix fills with contrast.  Bowel is normal.  No pelvic or retroperitoneal lymphadenopathy.  No acute osseous abnormality.  IMPRESSION: Pancreatic tail hypo enhancement, peripancreatic stranding and fluid compatible with acute pancreatitis.  No definable peripancreatic fluid collection is seen to suggest pseudocyst formation, and there is no ductal dilatation.  Nonobstructing right renal calculi.  Original Report Authenticated By: Harrel Lemon, M.D.   Dg Abd Acute W/chest  11/11/2011  *RADIOLOGY REPORT*  Clinical Data: Severe abdominal pain.  Elevated white blood cell count.  ACUTE ABDOMEN SERIES (ABDOMEN 2 VIEW & CHEST 1 VIEW)  Comparison: None.  Findings: No active cardiopulmonary disease.  No free air underneath the hemidiaphragms.  Lung volumes are slightly low.  The bowel gas pattern is nonobstructive.  No pathologic air fluid levels.  No dilation of large or small bowel.  IMPRESSION: Nonobstructive bowel gas pattern.  No acute abnormality.  Original Report Authenticated By: Andreas Newport, M.D.    Medications:  Current Facility-Administered Medications  Medication Dose Route Frequency Provider Last Rate Last Dose  . 0.9 %  sodium chloride infusion   Intravenous Continuous Nayana Abrol 150 mL/hr at 11/12/11 1400    . enoxaparin (LOVENOX) injection 40 mg  40 mg Subcutaneous Q24H Nayana Abrol   40 mg at 11/11/11 1811  . folic acid (FOLVITE) tablet 1 mg  1 mg Oral Daily Pleas Koch, MD   1 mg at 11/12/11 0847  . gemfibrozil (LOPID) tablet 600 mg  600 mg Oral BID AC Josep Luviano Jarrett-Davis      . HYDROmorphone (DILAUDID) injection 2 mg  2 mg Intravenous Q4H PRN Nayana Abrol   2 mg at 11/12/11 1513  . imipenem-cilastatin (PRIMAXIN) 500 mg in sodium chloride 0.9 % 100 mL IVPB  500 mg Intravenous Q6H Randall K Absher, PHARMD   500 mg at 11/12/11 1202  . iohexol (OMNIPAQUE) 300 MG/ML injection 100 mL  100 mL Intravenous Once  PRN Medication Radiologist   100 mL at 11/12/11 1542  . lisinopril (PRINIVIL,ZESTRIL) tablet 10 mg  10 mg Oral Daily Nayana Abrol   10 mg at 11/12/11 0843  . LORazepam (ATIVAN) injection 0.5 mg  0.5 mg Intravenous Q4H PRN Nayana Abrol   0.5 mg at 11/12/11 1156  . LORazepam (ATIVAN) tablet 1 mg  1 mg Oral Q6H PRN Pleas Koch, MD       Or  . LORazepam (ATIVAN) injection 1 mg  1 mg Intravenous Q6H PRN Pleas Koch, MD      . LORazepam (ATIVAN) tablet 0-4 mg  0-4 mg Oral Q6H Pleas Koch, MD   1 mg at 11/12/11 0653   Followed by  . LORazepam (ATIVAN) tablet 0-4 mg  0-4 mg Oral Q12H Pleas Koch, MD      . multivitamins ther. w/minerals tablet 1 tablet  1 tablet Oral Daily Pleas Koch, MD      . ondansetron [facility]) tablet 4 mg  4 mg Oral Q6H PRN Nayana Abrol       Or  . ondansetron (ZOFRAN) injection 4 mg  4 mg Intravenous Q6H PRN Nayana Abrol      . pantoprazole (PROTONIX) injection 40 mg  40 mg Intravenous QHS Nayana Abrol   40 mg at 11/11/11 2234  . prazosin (MINIPRESS) capsule 2 mg  2 mg Oral QHS Nayana  Abrol   2 mg at 11/11/11 2234  . sertraline (ZOLOFT) tablet 100 mg  100 mg Oral Daily Nayana Abrol   100 mg at 11/12/11 0848  . thiamine (VITAMIN B-1) tablet 100 mg  100 mg Oral Daily Pleas Koch, MD   100 mg at 11/12/11 0847   Or  . thiamine (B-1) injection 100 mg  100 mg Intravenous Daily Pleas Koch, MD        Assessment/Plan:  Alcohol intoxication Encourage cessation. Patient is on the alcohol withdrawal protocol.   Pancreatitis CT scan of the abdomen and pelvis does show pancreatitis. Will continue bowel rest.   Leukocytosis Probably Demargination from acute pancreatitis. Will recheck tomorrow. His  CT of the abdomen did not show any pseudocyst.   Transaminitis Secondary to alcohol. Monitor    LOS: 1 day   Earlene Plater MD, Ladell Pier 11/12/2011, 5:20 PM

## 2011-11-13 LAB — CBC
HCT: 39.2 % (ref 39.0–52.0)
Hemoglobin: 13.1 g/dL (ref 13.0–17.0)
MCH: 30.8 pg (ref 26.0–34.0)
MCHC: 33.4 g/dL (ref 30.0–36.0)
RDW: 14.8 % (ref 11.5–15.5)

## 2011-11-13 LAB — COMPREHENSIVE METABOLIC PANEL
Albumin: 2.7 g/dL — ABNORMAL LOW (ref 3.5–5.2)
BUN: 7 mg/dL (ref 6–23)
Calcium: 8.5 mg/dL (ref 8.4–10.5)
GFR calc Af Amer: >90 mL/min (ref >90)
Glucose, Bld: 88 mg/dL (ref 70–99)
Total Protein: 5.8 g/dL — ABNORMAL LOW (ref 6.0–8.3)

## 2011-11-13 MED ORDER — HYDROMORPHONE HCL PF 2 MG/ML IJ SOLN
INTRAMUSCULAR | Status: AC
  Administered 2011-11-13: 2 mg via INTRAVENOUS
  Filled 2011-11-13: qty 1

## 2011-11-13 MED ORDER — LISINOPRIL 40 MG PO TABS
40.0000 mg | ORAL_TABLET | Freq: Every day | ORAL | Status: DC
  Administered 2011-11-14 – 2011-11-15 (×2): 40 mg via ORAL
  Filled 2011-11-13 (×2): qty 1

## 2011-11-13 MED ORDER — MORPHINE SULFATE 15 MG PO TABS: 15.0000 mg | ORAL_TABLET | ORAL | Status: DC | PRN

## 2011-11-13 NOTE — Progress Notes (Signed)
Subjective: Patient is feeling a little better today. His alcohol withdrawal is controlled.  He complains of being hungry.  Objective: Vital signs in last 24 hours: Temp:  [97.4 F (36.3 C)-97.9 F (36.6 C)] 97.5 F (36.4 C) (11/18 1342) Pulse Rate:  [66-135] 135  (11/18 1342) Resp:  [18-22] 22  (11/18 1342) BP: (138-155)/(84-105) 153/105 mmHg (11/18 1342) SpO2:  [93 %-98 %] 98 % (11/18 1342) Weight change:   -Intake/Output from previous day: 11/17 0701 - 11/18 0700 In: 3390 [I.V.:3090; IV Piggyback:300] Out: 4525 [Urine:4525]  Blood pressure 153/105, pulse 135, temperature 97.5 F (36.4 C), temperature source Oral, resp. rate 22, height 5\' 7"  (1.702 m), weight 77.6 kg (171 lb 1.2 oz), SpO2 98.00%.   General: Patient appears his stated age. HEENT: Head normocephalic. Cardiovascular: Regular rate rhythm. Lungs: Clear to auscultation bilaterally. Abdomen: Soft diffusely tender positive bowel sounds. Extremities: No edema Lab Results: Lab Results  Component Value Date   WBC 11.5* 11/13/2011   HGB 13.1 11/13/2011   HCT 39.2 11/13/2011   MCV 92.2 11/13/2011   PLT 136* 11/13/2011    BMET  Lab 11/13/11 0611  NA 136  K 3.6  CL 102  CO2 27  BUN 7  CREATININE 0.78  LABGLOM --  GLUCOSE 88  CALCIUM 8.5    Studies/Results: Ct Abdomen Pelvis W Contrast  11/12/2011  *RADIOLOGY REPORT*  Clinical Data: Epigastric abdominal pain.  Pancreatitis.  CT ABDOMEN AND PELVIS WITH CONTRAST  Technique:  Multidetector CT imaging of the abdomen and pelvis was performed following the standard protocol during bolus administration of intravenous contrast.  Contrast: OMNIPAQUE IOHEXOL 300 MG/ML IV SOLN  Comparison: Abdominal ultrasound 11/11/2011. No similar prior study is available for comparison.  Findings: Linear left lower lobe scarring or atelectasis noted. Lung bases otherwise clear.  Borderline fatty liver.  No focal abnormality.  3 mm nonobstructing right lower renal pole calculus  identified on image 44.  2 mm right upper renal pole nonobstructing calculus image 36.  No radiopaque left renal or bilateral ureteral calculus.  The spleen, adrenal glands, and gallbladder are normal.  There is heterogeneous hypo enhancement of the pancreatic tail, image 24, with peripancreatic fluid and stranding surrounding the distal body and tail.  Fluid tracks inferiorly to the pelvis.  No focal measurable peripancreatic collection is identified to suggest pseudocyst formation.  No pancreatic ductal dilatation.  No extrahepatic ductal dilatation.  Low density fluid tracks inferiorly to the pelvis.  The normal- appearing appendix fills with contrast.  Bowel is normal.  No pelvic or retroperitoneal lymphadenopathy.  No acute osseous abnormality.  IMPRESSION: Pancreatic tail hypo enhancement, peripancreatic stranding and fluid compatible with acute pancreatitis.  No definable peripancreatic fluid collection is seen to suggest pseudocyst formation, and there is no ductal dilatation.  Nonobstructing right renal calculi.  Original Report Authenticated By: Harrel Lemon, M.D.    Medications:  Current Facility-Administered Medications  Medication Dose Route Frequency Provider Last Rate Last Dose  . 0.9 %  sodium chloride infusion   Intravenous Continuous Emerie Vanderkolk Jarrett-Davis 150 mL/hr at 11/12/11 1800    . enoxaparin (LOVENOX) injection 40 mg  40 mg Subcutaneous Q24H Nayana Abrol   40 mg at 11/12/11 1730  . folic acid (FOLVITE) tablet 1 mg  1 mg Oral Daily Pleas Koch, MD   1 mg at 11/13/11 1000  . gemfibrozil (LOPID) tablet 600 mg  600 mg Oral BID AC Benn Tarver Jarrett-Davis   600 mg at 11/13/11 0959  . HYDROmorphone (DILAUDID)  2 MG/ML injection        2 mg at 11/13/11 1012  . HYDROmorphone (DILAUDID) injection 2 mg  2 mg Intravenous Q4H PRN Nayana Abrol   2 mg at 11/13/11 0526  . imipenem-cilastatin (PRIMAXIN) 500 mg in sodium chloride 0.9 % 100 mL IVPB  500 mg Intravenous Q6H Randall K Absher, PHARMD    500 mg at 11/13/11 1226  . lisinopril (PRINIVIL,ZESTRIL) tablet 40 mg  40 mg Oral Daily Jarry Manon Jarrett-Davis      . LORazepam (ATIVAN) injection 0.5 mg  0.5 mg Intravenous Q4H PRN Nayana Abrol   0.5 mg at 11/12/11 1727  . LORazepam (ATIVAN) tablet 1 mg  1 mg Oral Q6H PRN Pleas Koch, MD       Or  . LORazepam (ATIVAN) injection 1 mg  1 mg Intravenous Q6H PRN Pleas Koch, MD   1 mg at 11/13/11 0526  . LORazepam (ATIVAN) tablet 0-4 mg  0-4 mg Oral Q6H Pleas Koch, MD   1 mg at 11/13/11 1226   Followed by  . LORazepam (ATIVAN) tablet 0-4 mg  0-4 mg Oral Q12H Pleas Koch, MD      . morphine (MSIR) tablet 15 mg  15 mg Oral Q4H PRN Annamae Shivley Jarrett-Davis      . multivitamins ther. w/minerals tablet 1 tablet  1 tablet Oral Daily Pleas Koch, MD      . nicotine (NICODERM CQ - dosed in mg/24 hours) patch 21 mg  21 mg Transdermal Daily Lyndia Bury Jarrett-Davis   21 mg at 11/13/11 0958  . ondansetron (ZOFRAN) tablet 4 mg  4 mg Oral Q6H PRN Nayana Abrol       Or  . ondansetron (ZOFRAN) injection 4 mg  4 mg Intravenous Q6H PRN Nayana Abrol      . pantoprazole (PROTONIX) injection 40 mg  40 mg Intravenous QHS Nayana Abrol   40 mg at 11/12/11 2154  . prazosin (MINIPRESS) capsule 2 mg  2 mg Oral QHS Nayana Abrol   2 mg at 11/12/11 2159  . sertraline (ZOLOFT) tablet 100 mg  100 mg Oral Daily Nayana Abrol   100 mg at 11/13/11 0959  . thiamine (VITAMIN B-1) tablet 100 mg  100 mg Oral Daily Pleas Koch, MD   100 mg at 11/13/11 6213   Or  . thiamine (B-1) injection 100 mg  100 mg Intravenous Daily Pleas Koch, MD   100 mg at 11/13/11 0957  . DISCONTD: lisinopril (PRINIVIL,ZESTRIL) tablet 10 mg  10 mg Oral Daily Nayana Abrol   10 mg at 11/13/11 1000    Assessment/Plan:  Alcohol intoxication Encourage cessation. Patient is on the alcohol withdrawal protocol.  He states that he will not no longer drink when leaves  the hospital.  Start patient on clear liquid diet and advance as tolerated.   Pancreatitis CT scan of  the abdomen and pelvis does show pancreatitis. Will continue bowel rest.   Leukocytosis Probably Demargination from acute pancreatitis.   Continues to improve. Will monitor  Transaminitis Secondary to alcohol. Improving   LOS: 2 days   Earlene Plater MD, [place] Pier 11/13/2011, 3:58 PM

## 2011-11-14 LAB — COMPREHENSIVE METABOLIC PANEL
Albumin: 2.6 g/dL — ABNORMAL LOW (ref 3.5–5.2)
Alkaline Phosphatase: 82 U/L (ref 39–117)
BUN: 4 mg/dL — ABNORMAL LOW (ref 6–23)
CO2: 28 mEq/L (ref 19–32)
Chloride: 105 mEq/L (ref 96–112)
GFR calc Af Amer: >90 mL/min (ref >90)
GFR calc non Af Amer: >90 mL/min (ref >90)
Glucose, Bld: 125 mg/dL — ABNORMAL HIGH (ref 70–99)
Potassium: 3.7 mEq/L (ref 3.5–5.1)
Total Bilirubin: 0.4 mg/dL (ref 0.3–1.2)

## 2011-11-14 LAB — CBC
HCT: 39.9 % (ref 39.0–52.0)
Hemoglobin: 12.9 g/dL — ABNORMAL LOW (ref 13.0–17.0)
RBC: 4.24 MIL/uL (ref 4.22–5.81)
WBC: 7.8 10*3/uL (ref 4.0–10.5)

## 2011-11-14 NOTE — ED Provider Notes (Signed)
Medical screening examination/treatment/procedure(s) were performed by non-physician practitioner and as supervising physician I was immediately available for consultation/collaboration.   Hanley Seamen, MD 11/14/11 2258

## 2011-11-14 NOTE — Progress Notes (Signed)
Subjective: Patient is feeling a little better today. His alcohol withdrawal is controlled.   Objective: Vital signs in last 24 hours: Temp:  [97.7 F (36.5 C)-98.1 F (36.7 C)] 98.1 F (36.7 C) (11/19 1425) Pulse Rate:  [75-107] 75  (11/19 1425) Resp:  [16-18] 16  (11/19 1425) BP: (139-161)/(89-95) 139/89 mmHg (11/19 1425) SpO2:  [96 %-98 %] 98 % (11/19 1425) Weight change:   -Intake/Output from previous day: 11/18 0701 - 11/19 0700 In: 3920 [P.O.:1920; I.V.:1800; IV Piggyback:200] Out: 3003 [Urine:3000; Stool:3]  Blood pressure 139/89, pulse 75, temperature 98.1 F (36.7 C), temperature source Oral, resp. rate 16, height 5\' 7"  (1.702 m), weight 77.6 kg (171 lb 1.2 oz), SpO2 98.00%.   General: Patient appears his stated age. HEENT: Head normocephalic. Cardiovascular: Regular rate rhythm. Lungs: Clear to auscultation bilaterally. Abdomen: Soft non-tender positive bowel sounds. Extremities: No edema Lab Results: Lab Results  Component Value Date   WBC 7.8 11/14/2011   HGB 12.9* 11/14/2011   HCT 39.9 11/14/2011   MCV 94.1 11/14/2011   PLT 153 11/14/2011    BMET  Lab 11/14/11 0535  NA 139  K 3.7  CL 105  CO2 28  BUN 4*  CREATININE 0.89  LABGLOM --  GLUCOSE 125*  CALCIUM 8.7    Studies/Results: No results found.  Medications:  Current Facility-Administered Medications  Medication Dose Route Frequency Provider Last Rate Last Dose  . enoxaparin (LOVENOX) injection 40 mg  40 mg Subcutaneous Q24H Nayana Abrol   40 mg at 11/13/11 1724  . folic acid (FOLVITE) tablet 1 mg  1 mg Oral Daily Pleas Koch, MD   1 mg at 11/14/11 0912  . gemfibrozil (LOPID) tablet 600 mg  600 mg Oral BID AC Marzell Isakson Jarrett-Davis   600 mg at 11/14/11 1630  . HYDROmorphone (DILAUDID) injection 2 mg  2 mg Intravenous Q4H PRN Nayana Abrol   2 mg at 11/13/11 2037  . lisinopril (PRINIVIL,ZESTRIL) tablet 40 mg  40 mg Oral Daily Maygan Koeller Jarrett-Davis   40 mg at 11/14/11 0912  . LORazepam (ATIVAN)  injection 0.5 mg  0.5 mg Intravenous Q4H PRN Nayana Abrol   0.5 mg at 11/12/11 1727  . LORazepam (ATIVAN) tablet 1 mg  1 mg Oral Q6H PRN Pleas Koch, MD       Or  . LORazepam (ATIVAN) injection 1 mg  1 mg Intravenous Q6H PRN Pleas Koch, MD      . LORazepam (ATIVAN) tablet 0-4 mg  0-4 mg Oral Q6H Pleas Koch, MD   1 mg at 11/13/11 1728   Followed by  . LORazepam (ATIVAN) tablet 0-4 mg  0-4 mg Oral Q12H Pleas Koch, MD   1 mg at 11/14/11 0640  . morphine (MSIR) tablet 15 mg  15 mg Oral Q4H PRN Makaley Storts Jarrett-Davis      . multivitamins ther. w/minerals tablet 1 tablet  1 tablet Oral Daily Pleas Koch, MD   1 tablet at 11/14/11 0912  . nicotine (NICODERM CQ - dosed in mg/24 hours) patch 21 mg  21 mg Transdermal Daily Jannelly Bergren Jarrett-Davis   21 mg at 11/14/11 0912  . ondansetron (ZOFRAN) tablet 4 mg  4 mg Oral Q6H PRN Nayana Abrol       Or  . ondansetron (ZOFRAN) injection 4 mg  4 mg Intravenous Q6H PRN Nayana Abrol      . pantoprazole (PROTONIX) injection 40 mg  40 mg Intravenous QHS Nayana Abrol   40 mg at 11/13/11 2222  . prazosin (MINIPRESS) capsule  2 mg  2 mg Oral QHS Nayana Abrol   2 mg at 11/13/11 2222  . sertraline (ZOLOFT) tablet 100 mg  100 mg Oral Daily Nayana Abrol   100 mg at 11/14/11 0913  . thiamine (VITAMIN B-1) tablet 100 mg  100 mg Oral Daily Pleas Koch, MD   100 mg at 11/14/11 0912   Or  . thiamine (B-1) injection 100 mg  100 mg Intravenous Daily Pleas Koch, MD   100 mg at 11/13/11 0957  . DISCONTD: 0.9 %  sodium chloride infusion   Intravenous Continuous Riggs Dineen Jarrett-Davis 50 mL/hr at 11/13/11 1542 50 mL/hr at 11/13/11 1542  . DISCONTD: imipenem-cilastatin (PRIMAXIN) 500 mg in sodium chloride 0.9 % 100 mL IVPB  500 mg Intravenous Q6H Randall K Absher, PHARMD   500 mg at 11/14/11 1205    Assessment/Plan:  Alcohol intoxication Encourage cessation. Patient is on the alcohol withdrawal protocol.  He states that he will not no longer drink when leaves  the hospital.  Advance to  regular diet..   Pancreatitis CT scan of the abdomen and pelvis does show pancreatitis.    Leukocytosis Probably Demargination from acute pancreatitis.   Resolved  Transaminitis Secondary to alcohol. Improving  Disposition:  Patient if stable can be discharged tomorrow.   LOS: 3 days   Earlene Plater MD, [place] Pier 11/14/2011, 6:08 PM

## 2011-11-14 NOTE — Progress Notes (Signed)
ANTIBIOTIC CONSULT NOTE - Follow Up  Pharmacy Consult for Primaxin Indication: Pancreatitis  Allergies  Allergen Reactions  . Percocet (Oxycodone-Acetaminophen)     Patient Measurements: Height: 5\' 7"  (170.2 cm) Weight: 171 lb 1.2 oz (77.6 kg) IBW/kg (Calculated) : 66.1  Ht 5'7" Wt 165 lbs (=75 kg) [Both Ht and Wt stated by patient / significant other]  Vital Signs: Temp: 97.7 F (36.5 C) (11/19 0455) Temp src: Oral (11/19 0455) BP: 161/91 mmHg (11/19 0455) Pulse Rate: 107  (11/19 0455)  11/18 0701 - 11/19 0700 In: 3920 [P.O.:1920; I.V.:1800; IV Piggyback:200] Out: 3003 [Urine:3000; Stool:3]  Total I/O In: 240 [P.O.:240] Out: 700 [Urine:700]  Labs:  [facility] 11/14/11 0535 11/13/11 0611 11/12/11 1225  WBC 7.8 11.5* 22.8*  HGB 12.9* 13.1 14.2  PLT 153 136* 145*  LABCREA -- -- --  CREATININE 0.89 0.78 0.81   CrCl > 110mL/min/1.73m2  Microbiology: Recent Results (from the past 720 hour(s))  MRSA PCR SCREENING     Status: Normal   Collection Time   11/12/11 12:17 PM      Component Value Range Status Comment   MRSA by PCR NEGATIVE  NEGATIVE  Final     Medical History: Past Medical History  Diagnosis Date  . Hypertension   . PTSD (post-traumatic stress disorder)   . Anxiety   . Hx of colonic polyps     Medications:  Home Medications    Current Outpatient Rx   Name  Route  Sig  Dispense  Refill   .  LISINOPRIL 10 MG PO TABS  Oral  Take 10 mg by mouth daily.     Marland Kitchen  PRAZOSIN HCL 2 MG PO CAPS  Oral  Take 2 mg by mouth at bedtime.     .  SERTRALINE HCL 100 MG PO TABS  Oral  Take 100 mg by mouth daily.         Assessment: 30 y/o M with pancreatitis, confirmed with CT scan of the abdomen and pelvis. Continuing bowel rest and day #4/x IV antibiotics.  WBC wnl, AF, no pending cultures. No length of antibiotic treatment defined. CrCl stable, so will continue current antibiotic dosing.  Goal of Therapy:  Empiric Primaxin with dosage adjusted for weight and  renal function.  Plan:  Continue Primaxin 500mg  IV q6h. Will f/u renal function and LOT.  Clance Boll 11/14/2011 11:17 AM

## 2011-11-15 DIAGNOSIS — E781 Pure hyperglyceridemia: Secondary | ICD-10-CM | POA: Diagnosis present

## 2011-11-15 LAB — CBC
MCV: 93.4 fL (ref 78.0–100.0)
Platelets: 183 10*3/uL (ref 150–400)
RDW: 14.9 % (ref 11.5–15.5)
WBC: 8.1 10*3/uL (ref 4.0–10.5)

## 2011-11-15 LAB — COMPREHENSIVE METABOLIC PANEL
AST: 46 U/L — ABNORMAL HIGH (ref 0–37)
Albumin: 2.8 g/dL — ABNORMAL LOW (ref 3.5–5.2)
Chloride: 104 mEq/L (ref 96–112)
Creatinine, Ser: 1.02 mg/dL (ref 0.50–1.35)
Total Bilirubin: 0.3 mg/dL (ref 0.3–1.2)

## 2011-11-15 MED ORDER — FOLIC ACID 1 MG PO TABS: 1.0000 mg | ORAL_TABLET | Freq: Every day | ORAL | Status: AC

## 2011-11-15 MED ORDER — LISINOPRIL 10 MG PO TABS: 40.0000 mg | ORAL_TABLET | Freq: Every day | ORAL | Status: DC

## 2011-11-15 MED ORDER — PANTOPRAZOLE SODIUM 40 MG PO TBEC
40.0000 mg | DELAYED_RELEASE_TABLET | Freq: Every day | ORAL | Status: DC
  Administered 2011-11-15: 40 mg via ORAL
  Filled 2011-11-15: qty 1

## 2011-11-15 MED ORDER — LISINOPRIL 40 MG PO TABS: 40.0000 mg | ORAL_TABLET | Freq: Every day | ORAL | Status: DC

## 2011-11-15 MED ORDER — THERA M PLUS PO TABS: 1.0000 | ORAL_TABLET | Freq: Every day | ORAL | Status: DC

## 2011-11-15 MED ORDER — THIAMINE HCL 100 MG PO TABS: 100.0000 mg | ORAL_TABLET | Freq: Every day | ORAL | Status: AC

## 2011-11-15 MED ORDER — GEMFIBROZIL 600 MG PO TABS: 600.0000 mg | ORAL_TABLET | Freq: Two times a day (BID) | ORAL | Status: DC

## 2011-11-15 MED ORDER — NICOTINE 21 MG/24HR TD PT24: 1.0000 | MEDICATED_PATCH | Freq: Every day | TRANSDERMAL | Status: AC

## 2011-11-15 NOTE — Discharge Summary (Signed)
Physician Discharge Summary  Patient ID: Roy Byrd MRN: [id number] DOB/AGE: 23-Nov-1981 29 y.o.  Admit date: 11/11/2011 Discharge date: 11/15/2011  Discharge Diagnoses:   Alcohol intoxication  Pancreatitis  Transaminitis  Hypertriglyceridemia  Leukocytosis   Discharge Medication List as of 11/15/2011  2:30 PM    START taking these medications   Details  folic acid (FOLVITE) 1 MG tablet Take 1 tablet (1 mg total) by mouth daily., Starting 11/15/2011, Until Wed 11/14/12, Print    gemfibrozil (LOPID) 600 MG tablet Take 1 tablet (600 mg total) by mouth 2 (two) times daily before a meal., Starting 11/15/2011, Until Wed 11/14/12, Print    Multiple Vitamins-Minerals (MULTIVITAMINS THER. W/MINERALS) TABS Take 1 tablet by mouth daily., Starting 11/15/2011, Until Discontinued, Print    nicotine (NICODERM CQ - DOSED IN MG/24 HOURS) 21 mg/24hr patch Place 1 patch (21 patches total) onto the skin daily., Starting 11/15/2011, Until Thu 12/15/11, Print    thiamine 100 MG tablet Take 1 tablet (100 mg total) by mouth daily., Starting 11/15/2011, Until Wed 11/14/12, Print      CONTINUE these medications which have CHANGED   Details  lisinopril (PRINIVIL,ZESTRIL) 40 MG tablet Take 1 tablet (40 mg total) by mouth daily., Starting 11/15/2011, Until Discontinued, Print      CONTINUE these medications which have NOT CHANGED   Details  prazosin (MINIPRESS) 2 MG capsule Take 2 mg by mouth at bedtime.  , Until Discontinued, Historical Med    sertraline (ZOLOFT) 100 MG tablet Take 100 mg by mouth daily.  , Until Discontinued, Historical Med        Discharge Orders    Future Orders Please Complete By Expires   Diet - low sodium heart healthy      Increase activity slowly         Follow-up Information    Follow up in 1 week.         Disposition: Home or Self Care  Discharged Condition: Stable  Full Code   Consults: Treatment Team:  Lilyan Gilford, MD   History of present  illness: 30 year old male who presents with epigastric pain for 2 days  The primary symptoms of the illness include abdominal pain, nausea and vomiting. The patient admits to drinking on a regular basis. No prior episodes of pancreatitis. 2 days ago he started developing epigastric pain associated with nausea and multiple episodes of vomiting. He states that he has had lower GI bleeding and has had a colonoscopy in the past, no obvious cause of his bleeding was found. No history of any abdominal surgeries..  patient complains of abdominal pain acute in onset 2 days ago. The pain is located in the epigastric region with radiation to the back. It is described as stabbing and is severe. It is gradually worsening. Movement and palpation make the pain worse, nothing makes the pain better. It associated with fever, chills.. There is no change in the patients bowel function. The patient denies any history of pancreatitis though he does drink alcohol frequently. He has tried Aleve for the pain with no relief.    PMH:  As per H & P FH: As per  H & P SH: As per H & P Med: as per H & P Allergies and ROS: as per H&P   Discharge Exam: Blood pressure 126/83, pulse 74, temperature 98.2 F (36.8 C), temperature source Oral, resp. rate 19, height 5\' 7"  (1.702 m), weight 77.6 kg (171 lb 1.2 oz), SpO2 94.00%.  General: Patient appears  his stated age.  HEENT: Head normocephalic.  Cardiovascular: Regular rate rhythm.  Lungs: Clear to auscultation bilaterally.  Abdomen: Soft non-tender positive bowel sounds.  Extremities: No edema  Hospital Course:  Alcohol intoxication Patient presented to the hospital with alcohol intoxication. He had nausea and vomiting for a few days, for the acute pancreatitis he was treated with bowel rest and IV fluids and IV narcotics. His symptoms improved. He did have a CT scan of the abdomen and pelvis that shows inflamed pancreas but no pseudocyst. He was also started on Primaxin at  the time of admission secondary to concerns of pseudocyst. After the CAT scan was negative the Primaxin was discontinued. Patient was counseled against drinking any alcohol. He will be discharged home today.  He is tolerating diet well without any problem his enzymes have normalized. He will followup with the [facility] l discussed this with the patient. He will be going home with his parents today.   Pancreatitis Patient's pancreatitis was most likely multifactorial his alcohol level was elevated he drinks daily but he also has elevated triglycerides. He was started on gemfibrozil and told to abstain from alcohol.   Transaminitis Liver enzymes have almost completely normalized since patient's admission. Patient to follow up with primary care physician to recheck his LFTs.   Hypertriglyceridemia Patient was started on gemfibrozil. He could follow up with his doctor to get a repeat cholesterol in 2-3 months.   Leukocytosis Patient was noted to have elevated white count on admission. This is most likely de-margination from stress. All his cultures were normal. His white count has since normalized.  Labs:   Results for orders placed during the hospital encounter of 11/11/11 (from the past 48 hour(s))  CBC     Status: Abnormal   Collection Time   11/14/11  5:35 AM      Component Value Range Comment   WBC 7.8  4.0 - 10.5 (K/uL)    RBC 4.24  4.22 - 5.81 (MIL/uL)    Hemoglobin 12.9 (*) 13.0 - 17.0 (g/dL)    HCT 04.5  40.9 - 81.1 (%)    MCV 94.1  78.0 - 100.0 (fL)    MCH 30.4  26.0 - 34.0 (pg)    MCHC 32.3  30.0 - 36.0 (g/dL)    RDW 91.4  78.2 - 95.6 (%)    Platelets 153  150 - 400 (K/uL)   COMPREHENSIVE METABOLIC PANEL     Status: Abnormal   Collection Time   11/14/11  5:35 AM      Component Value Range Comment   Sodium 139  135 - 145 (mEq/L)    Potassium 3.7  3.5 - 5.1 (mEq/L)    Chloride 105  96 - 112 (mEq/L)    CO2 28  19 - 32 (mEq/L)    Glucose, Bld 125 (*) 70 - 99 (mg/dL)    BUN  4 (*) 6 - 23 (mg/dL)    Creatinine, Ser 2.13  0.50 - 1.35 (mg/dL)    Calcium 8.7  8.4 - 10.5 (mg/dL)    Total Protein 6.0  6.0 - 8.3 (g/dL)    Albumin 2.6 (*) 3.5 - 5.2 (g/dL)    AST 41 (*) 0 - 37 (U/L)    ALT 36  0 - 53 (U/L)    Alkaline Phosphatase 82  39 - 117 (U/L)    Total Bilirubin 0.4  0.3 - 1.2 (mg/dL)    GFR calc non Af Amer >90  >90 (mL/min)  GFR calc Af Amer >90  >90 (mL/min)   CBC     Status: Normal   Collection Time   11/15/11  5:31 AM      Component Value Range Comment   WBC 8.1  4.0 - 10.5 (K/uL)    RBC 4.42  4.22 - 5.81 (MIL/uL)    Hemoglobin 13.6  13.0 - 17.0 (g/dL)    HCT 16.1  09.6 - 04.5 (%)    MCV 93.4  78.0 - 100.0 (fL)    MCH 30.8  26.0 - 34.0 (pg)    MCHC 32.9  30.0 - 36.0 (g/dL)    RDW 40.9  81.1 - 91.4 (%)    Platelets 183  150 - 400 (K/uL)   COMPREHENSIVE METABOLIC PANEL     Status: Abnormal   Collection Time   11/15/11  5:31 AM      Component Value Range Comment   Sodium 140  135 - 145 (mEq/L)    Potassium 3.6  3.5 - 5.1 (mEq/L)    Chloride 104  96 - 112 (mEq/L)    CO2 28  19 - 32 (mEq/L)    Glucose, Bld 138 (*) 70 - 99 (mg/dL)    BUN 7  6 - 23 (mg/dL)    Creatinine, Ser 7.82  0.50 - 1.35 (mg/dL)    Calcium 9.6  8.4 - 10.5 (mg/dL)    Total Protein 6.3  6.0 - 8.3 (g/dL)    Albumin 2.8 (*) 3.5 - 5.2 (g/dL)    AST 46 (*) 0 - 37 (U/L)    ALT 36  0 - 53 (U/L)    Alkaline Phosphatase 103  39 - 117 (U/L)    Total Bilirubin 0.3  0.3 - 1.2 (mg/dL)    GFR calc non Af Amer >90  >90 (mL/min)    GFR calc Af Amer >90  >90 (mL/min)     Diagnostics:  US Abdomen Complete  11/11/2011  *RADIOLOGY REPORT*  Clinical Data:  Unexplained abdominal pain.  COMPLETE ABDOMINAL ULTRASOUND 11/11/2011:  Comparison:  Acute abdomen series same date.  No prior ultrasound.  Findings:  Gallbladder:  No shadowing gallstones or echogenic sludge.  No gallbladder wall thickening or pericholecystic fluid.  Negative sonographic Murphy's sign according to the ultrasound  technologist.  Common bile duct:  Normal in caliber with maximum diameter approximating 4 mm.  Liver:  Normal size and echotexture without focal parenchymal abnormality.  Patent portal vein with hepatopetal flow.  IVC:  Patent.  Pancreas:  Although the pancreas is difficult to visualize in its entirety, no focal pancreatic abnormality is identified.  Spleen:  Normal size and echotexture without focal parenchymal abnormality.  Right Kidney:  No hydronephrosis.  Well-preserved cortex.  No shadowing calculi.  Normal size and parenchymal echotexture without focal abnormalities.  Approximately 11.1 cm in length.  Left Kidney:  No hydronephrosis.  Well-preserved cortex.  No shadowing calculi.  Normal size and parenchymal echotexture without focal abnormalities.  Approximately 10.6 cm in length.  Abdominal aorta:  Normal in caliber throughout its visualized course in the abdomen without significant atherosclerosis.  IMPRESSION: Normal examination.  Original Report Authenticated By: Arnell Sieving, M.D.   Ct Abdomen Pelvis W Contrast  11/12/2011  *RADIOLOGY REPORT*  Clinical Data: Epigastric abdominal pain.  Pancreatitis.  CT ABDOMEN AND PELVIS WITH CONTRAST  Technique:  Multidetector CT imaging of the abdomen and pelvis was performed following the standard protocol during bolus administration of intravenous contrast.  Contrast: OMNIPAQUE IOHEXOL 300 MG/ML IV  SOLN  Comparison: Abdominal ultrasound 11/11/2011. No similar prior study is available for comparison.  Findings: Linear left lower lobe scarring or atelectasis noted. Lung bases otherwise clear.  Borderline fatty liver.  No focal abnormality.  3 mm nonobstructing right lower renal pole calculus identified on image 44.  2 mm right upper renal pole nonobstructing calculus image 36.  No radiopaque left renal or bilateral ureteral calculus.  The spleen, adrenal glands, and gallbladder are normal.  There is heterogeneous hypo enhancement of the pancreatic  tail, image 24, with peripancreatic fluid and stranding surrounding the distal body and tail.  Fluid tracks inferiorly to the pelvis.  No focal measurable peripancreatic collection is identified to suggest pseudocyst formation.  No pancreatic ductal dilatation.  No extrahepatic ductal dilatation.  Low density fluid tracks inferiorly to the pelvis.  The normal- appearing appendix fills with contrast.  Bowel is normal.  No pelvic or retroperitoneal lymphadenopathy.  No acute osseous abnormality.  IMPRESSION: Pancreatic tail hypo enhancement, peripancreatic stranding and fluid compatible with acute pancreatitis.  No definable peripancreatic fluid collection is seen to suggest pseudocyst formation, and there is no ductal dilatation.  Nonobstructing right renal calculi.  Original Report Authenticated By: Harrel Lemon, M.D.   Dg Abd Acute W/chest  11/11/2011  *RADIOLOGY REPORT*  Clinical Data: Severe abdominal pain.  Elevated white blood cell count.  ACUTE ABDOMEN SERIES (ABDOMEN 2 VIEW & CHEST 1 VIEW)  Comparison: None.  Findings: No active cardiopulmonary disease.  No free air underneath the hemidiaphragms.  Lung volumes are slightly low.  The bowel gas pattern is nonobstructive.  No pathologic air fluid levels.  No dilation of large or small bowel.  IMPRESSION: Nonobstructive bowel gas pattern.  No acute abnormality.  Original Report Authenticated By: Andreas Newport, M.D.    SignedEarlene Plater MD, Ladell Pier 11/15/2011, 3:36 PM

## 2011-11-15 NOTE — Progress Notes (Signed)
This patient is receiving IV Protonix. Based on criteria approved by the Pharmacy and Therapeutics Committee, this medication is being converted to the equivalent oral dose form. These criteria include: . The patient is eating (either orally or per tube) and/or has been taking other orally administered medications for at least 24 hours. . This patient has no evidence of active gastrointestinal bleeding or impaired GI absorption (gastrectomy, short bowel, patient on TNA or NPO).  If you have questions about this conversion, please contact the pharmacy department. Thank you.  

## 2012-09-26 IMAGING — CR DG ABDOMEN ACUTE W/ 1V CHEST
4 series · 4 of 4 positions shown · non-contrast
Comparison: None.

CLINICAL DATA: Severe abdominal pain.  Elevated white blood cell
count.

ACUTE ABDOMEN SERIES (ABDOMEN 2 VIEW & CHEST 1 VIEW)

[w abdomen decub]
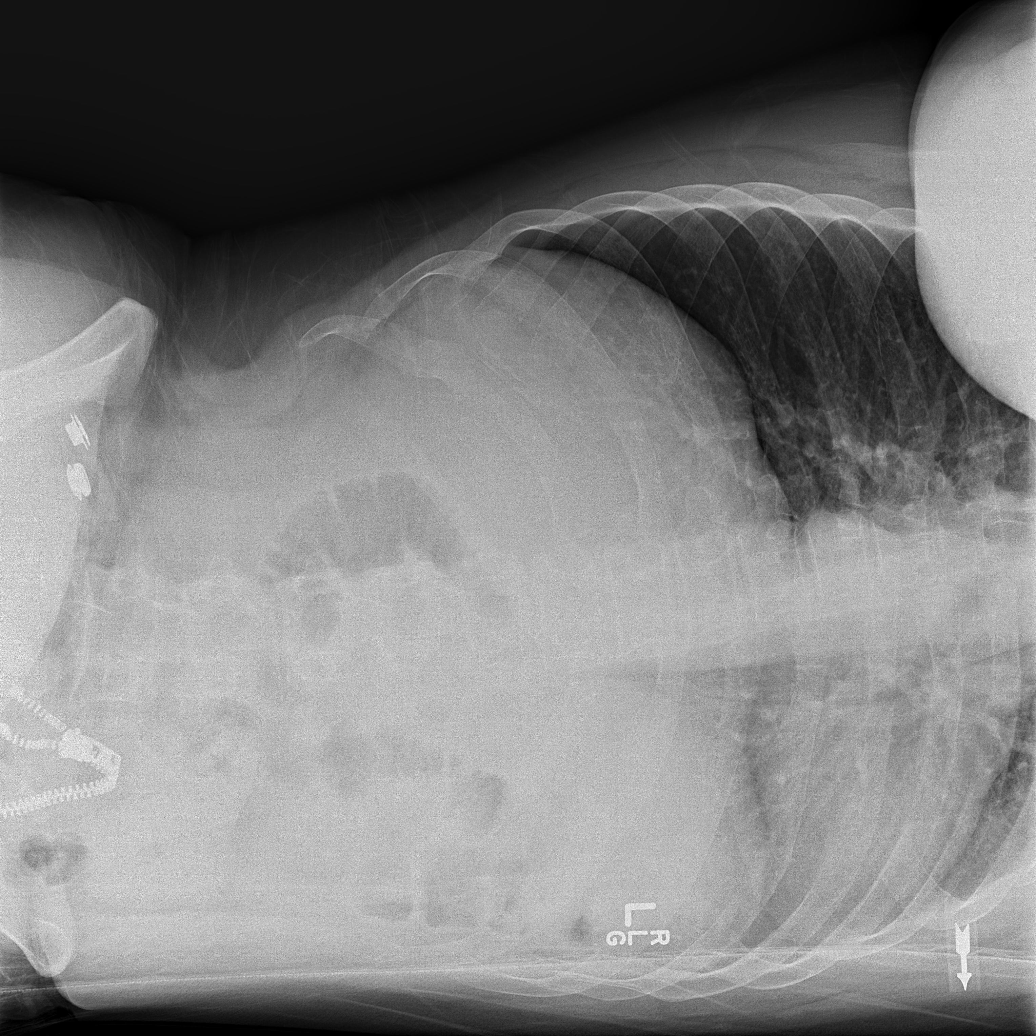

[x abdomen supine (1 of 2)]
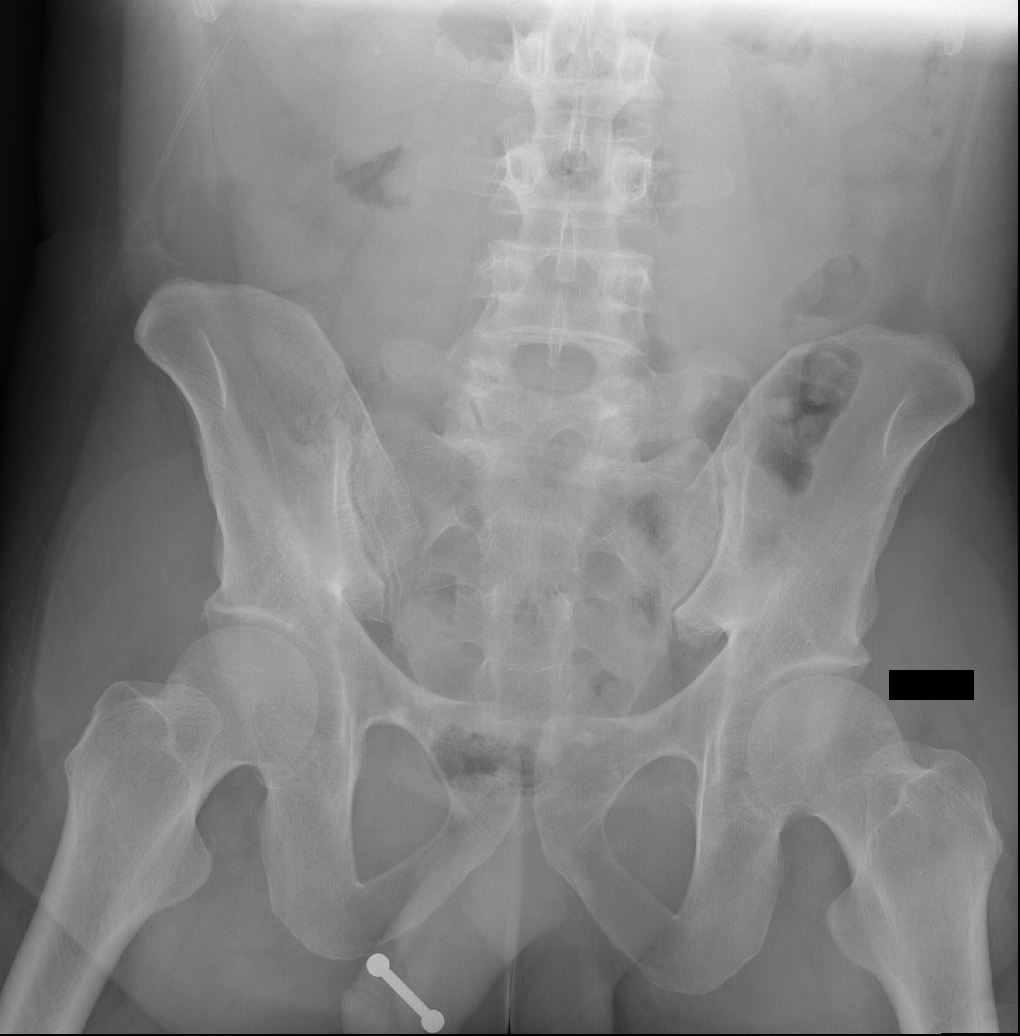

[x abdomen supine (2 of 2)]
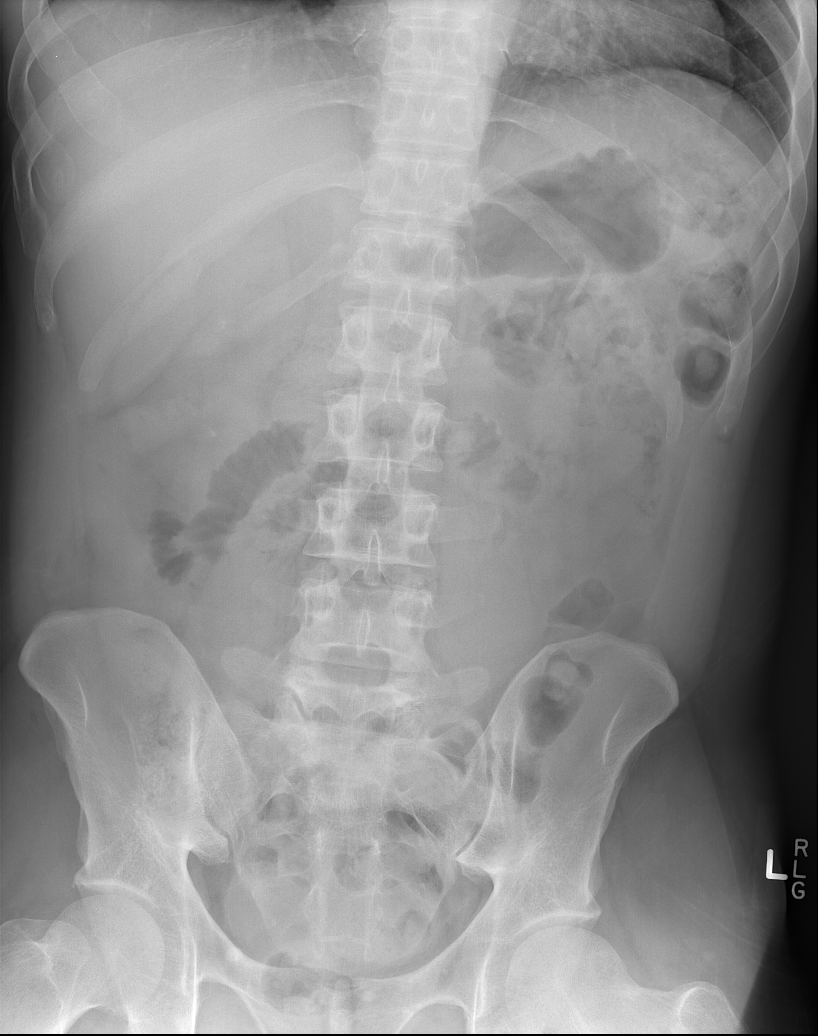

[x chest ap]
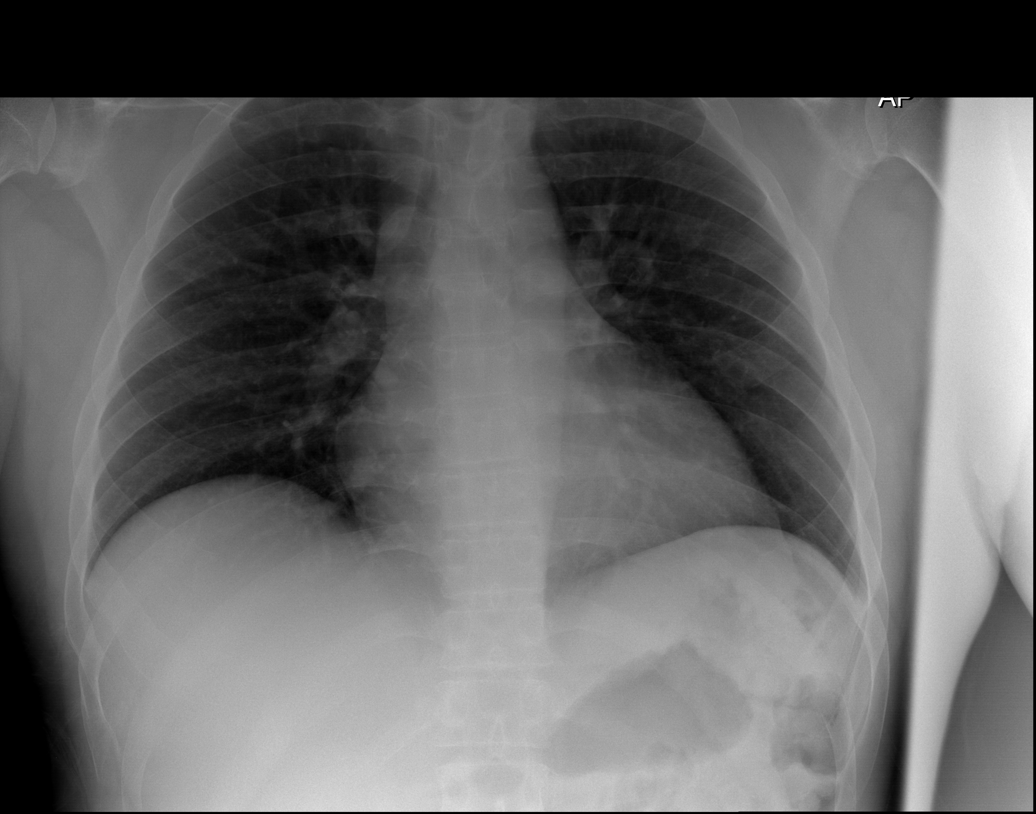

[4 of 4 positions shown; findings below may reference images not displayed]

FINDINGS: No active cardiopulmonary disease.  No free air
underneath the hemidiaphragms.  Lung volumes are slightly low.  The
bowel gas pattern is nonobstructive.  No pathologic air fluid
levels.  No dilation of large or small bowel.
IMPRESSION: Nonobstructive bowel gas pattern.  No acute abnormality.

## 2014-03-31 ENCOUNTER — Encounter (HOSPITAL_COMMUNITY): Payer: Self-pay | Admitting: Emergency Medicine

## 2014-03-31 ENCOUNTER — Emergency Department (HOSPITAL_COMMUNITY): Payer: Non-veteran care

## 2014-03-31 ENCOUNTER — Emergency Department (HOSPITAL_COMMUNITY)
Admission: EM | Admit: 2014-03-31 | Discharge: 2014-04-01 | Disposition: A | Discharge status: Home / Self Care | Payer: Non-veteran care | Attending: Emergency Medicine | Admitting: Emergency Medicine

## 2014-03-31 DIAGNOSIS — Z860100 Personal history of colon polyps, unspecified: Secondary | ICD-10-CM | POA: Insufficient documentation

## 2014-03-31 DIAGNOSIS — F4310 Post-traumatic stress disorder, unspecified: Secondary | ICD-10-CM | POA: Insufficient documentation

## 2014-03-31 DIAGNOSIS — F17200 Nicotine dependence, unspecified, uncomplicated: Secondary | ICD-10-CM | POA: Insufficient documentation

## 2014-03-31 DIAGNOSIS — IMO0002 Reserved for concepts with insufficient information to code with codable children: Secondary | ICD-10-CM | POA: Insufficient documentation

## 2014-03-31 DIAGNOSIS — Z79899 Other long term (current) drug therapy: Secondary | ICD-10-CM | POA: Insufficient documentation

## 2014-03-31 DIAGNOSIS — R61 Generalized hyperhidrosis: Secondary | ICD-10-CM | POA: Insufficient documentation

## 2014-03-31 DIAGNOSIS — I10 Essential (primary) hypertension: Secondary | ICD-10-CM | POA: Insufficient documentation

## 2014-03-31 DIAGNOSIS — F411 Generalized anxiety disorder: Secondary | ICD-10-CM | POA: Insufficient documentation

## 2014-03-31 DIAGNOSIS — R451 Restlessness and agitation: Secondary | ICD-10-CM

## 2014-03-31 DIAGNOSIS — R000 Tachycardia, unspecified: Secondary | ICD-10-CM | POA: Insufficient documentation

## 2014-03-31 LAB — ETHANOL: Alcohol, Ethyl (B): 33 mg/dL — ABNORMAL HIGH (ref 0–11)

## 2014-03-31 LAB — CBC WITH DIFFERENTIAL/PLATELET
BASOS PCT: 0 % (ref 0–1)
Basophils Absolute: 0 10*3/uL (ref 0.0–0.1)
EOS ABS: 0 10*3/uL (ref 0.0–0.7)
Eosinophils Relative: 0 % (ref 0–5)
HEMATOCRIT: 45 % (ref 39.0–52.0)
HEMOGLOBIN: 15.3 g/dL (ref 13.0–17.0)
LYMPHS ABS: 1 10*3/uL (ref 0.7–4.0)
Lymphocytes Relative: 13 % (ref 12–46)
MCH: 30.5 pg (ref 26.0–34.0)
MCHC: 34 g/dL (ref 30.0–36.0)
MCV: 89.6 fL (ref 78.0–100.0)
MONO ABS: 0.4 10*3/uL (ref 0.1–1.0)
MONOS PCT: 5 % (ref 3–12)
NEUTROS PCT: 81 % — AB (ref 43–77)
Neutro Abs: 6.4 10*3/uL (ref 1.7–7.7)
Platelets: 254 10*3/uL (ref 150–400)
RBC: 5.02 MIL/uL (ref 4.22–5.81)
RDW: 14.3 % (ref 11.5–15.5)
WBC: 7.9 10*3/uL (ref 4.0–10.5)

## 2014-03-31 LAB — RAPID URINE DRUG SCREEN, HOSP PERFORMED
AMPHETAMINES: NOT DETECTED
Barbiturates: NOT DETECTED
Benzodiazepines: NOT DETECTED
COCAINE: NOT DETECTED
OPIATES: NOT DETECTED
Tetrahydrocannabinol: NOT DETECTED

## 2014-03-31 LAB — COMPREHENSIVE METABOLIC PANEL
ALK PHOS: 99 U/L (ref 39–117)
ALT: 7 U/L (ref 0–53)
AST: 17 U/L (ref 0–37)
Albumin: 4.2 g/dL (ref 3.5–5.2)
BUN: 15 mg/dL (ref 6–23)
CO2: 19 mEq/L (ref 19–32)
CREATININE: 0.78 mg/dL (ref 0.50–1.35)
Calcium: 9.7 mg/dL (ref 8.4–10.5)
Chloride: 101 mEq/L (ref 96–112)
GFR calc non Af Amer: >90 mL/min (ref >90)
GLUCOSE: 155 mg/dL — AB (ref 70–99)
POTASSIUM: 3.8 meq/L (ref 3.7–5.3)
Sodium: 146 mEq/L (ref 137–147)
TOTAL PROTEIN: 7.8 g/dL (ref 6.0–8.3)

## 2014-03-31 LAB — SALICYLATE LEVEL

## 2014-03-31 LAB — ACETAMINOPHEN LEVEL

## 2014-03-31 MED ORDER — STERILE WATER FOR INJECTION IJ SOLN
INTRAMUSCULAR | Status: AC
  Administered 2014-03-31: 0.6 mL
  Filled 2014-03-31: qty 10

## 2014-03-31 MED ORDER — LORAZEPAM 2 MG/ML IJ SOLN
2.0000 mg | Freq: Once | INTRAMUSCULAR | Status: AC
  Administered 2014-03-31: 2 mg via INTRAVENOUS

## 2014-03-31 MED ORDER — SODIUM CHLORIDE 0.9 % IV SOLN
1000.0000 mL | INTRAVENOUS | Status: DC
  Administered 2014-03-31 – 2014-04-01 (×2): 1000 mL via INTRAVENOUS

## 2014-03-31 MED ORDER — SODIUM CHLORIDE 0.9 % IV SOLN
1000.0000 mL | Freq: Once | INTRAVENOUS | Status: AC
  Administered 2014-03-31: 1000 mL via INTRAVENOUS

## 2014-03-31 MED ORDER — SODIUM CHLORIDE 0.9 % IV BOLUS (SEPSIS)
1000.0000 mL | Freq: Once | INTRAVENOUS | Status: AC
  Administered 2014-03-31: 1000 mL via INTRAVENOUS

## 2014-03-31 MED ORDER — ZIPRASIDONE MESYLATE 20 MG IM SOLR
10.0000 mg | Freq: Once | INTRAMUSCULAR | Status: AC
  Administered 2014-03-31: 10 mg via INTRAMUSCULAR

## 2014-03-31 MED ORDER — ZIPRASIDONE MESYLATE 20 MG IM SOLR
10.0000 mg | Freq: Once | INTRAMUSCULAR | Status: AC
  Administered 2014-03-31: 10 mg via INTRAMUSCULAR
  Filled 2014-03-31: qty 20

## 2014-03-31 MED ORDER — LORAZEPAM 2 MG/ML IJ SOLN
INTRAMUSCULAR | Status: AC
  Filled 2014-03-31: qty 1

## 2014-03-31 MED ORDER — ZIPRASIDONE MESYLATE 20 MG IM SOLR: 20.0000 mg | Freq: Once | INTRAMUSCULAR | Status: DC

## 2014-03-31 MED ORDER — ZIPRASIDONE MESYLATE 20 MG IM SOLR
INTRAMUSCULAR | Status: AC
  Filled 2014-03-31: qty 20

## 2014-03-31 NOTE — ED Notes (Signed)
Pt very agitated and combative, sitting up, pulling at restraints.  Security and off duty [employer] at bedside.  EDPA aware.

## 2014-03-31 NOTE — ED Notes (Signed)
Patient is very agitated, sitting at the edge of the bed, cursing, and rocky back and forth. Patient is not following simple commands of staff. His father is at the bedside trying to reason with the patient, but he's not responding to his father's commands either. Attending notified.

## 2014-03-31 NOTE — ED Notes (Signed)
Off duty [employer] officer at bedside.  Pt more calm at this time, remains with blank stare, not talking, unable to be redirected.  Remains fighting against restraints most of the time and unsafe.  VSS.  Father at bedside.  NAD.

## 2014-03-31 NOTE — ED Notes (Signed)
Initial Contact - pt placed to 4-point restraints at this time on arrival to ED.  Pt remains combative and aggressive.  Pt unable to be redirected at this time.  Pt placed to restraints for safety of pt and staff.  Police and security remain at bedside.  +csm/+pulses to extremities.  Pt remains moving all extremities and fighting against restraints.  Dr. Gwendolyn GrantWalden at bedside on arrival.

## 2014-03-31 NOTE — ED Notes (Addendum)
PER EMS - ems activated by father, father reports pt has been drinking tonight with "four locos", also vodka, no drug paraphenlia, ?track marks on R AC.  Pt catatonic, aggressive, combative, not redirectable.  Pt arrived to ED with police, handcuffed.  5mg  haldol given by EMS IV.

## 2014-03-31 NOTE — ED Notes (Signed)
Right wrist restraint tightened due to patient's moving back and forth. CMS positive.

## 2014-03-31 NOTE — ED Notes (Signed)
Pt remains combative and not redirectable.  [employer] and security remain at bedside for safety of staff and pt.  Pt remains with blank stare to eyes, no verbalizations, no recognition.  Skin remains diaphoretic, warm.  RR even/un-lab.  MAEI, +csm/+pulses.  Will CTM. NAD.

## 2014-03-31 NOTE — Progress Notes (Signed)
   CARE MANAGEMENT ED NOTE 03/31/2014  Patient:  Maureen RalphsCOLLIER,Shariq S   Account Number:  [id number]  Date Initiated:  03/31/2014  Documentation initiated by:  Radford PaxFERRERO,Mija Effertz  Subjective/Objective Assessment:   Patient presents to Ed with combative and aggressive behavior     Subjective/Objective Assessment Detail:   Patient placed in four point restraints, [employer] at beside.     Action/Plan:   Action/Plan Detail:   Anticipated DC Date:       Status Recommendation to Physician:   Result of Recommendation:    Other ED Services  Consult Working Plan    DC Planning Services  Other  PCP issues    Choice offered to / List presented to:            Status of service:  Completed, signed off  ED Comments:   ED Comments Detail:  Patient listed as not having a pcp.  EDCM attempted to speak to patient regarding pcp issues however,patient remains agitated, sitting up in the bed, not speaking. Patient not oriented at this time [facility].

## 2014-03-31 NOTE — ED Notes (Signed)
Randy-Dad [phone]. Pt dad's phone number.

## 2014-03-31 NOTE — ED Notes (Signed)
Bed: WA08 Expected date:  Expected time:  Means of arrival:  Comments: ems- ETOH

## 2014-04-01 ENCOUNTER — Emergency Department (HOSPITAL_COMMUNITY): Payer: Non-veteran care

## 2014-04-01 NOTE — ED Notes (Signed)
Patient transported to CT 

## 2014-04-01 NOTE — ED Notes (Signed)
Pt has brown cargo pants, brown belt, white socks, red t-shirt.

## 2014-04-01 NOTE — ED Provider Notes (Signed)
1:25 PM I spoke with the patient the patient's father length.  Mother reports he was in his normal state of health yesterday when he developed nausea and one episode of nonbloody nonbilious vomiting.  He had a near syncopal episode and 911 was called.  The father states that on arrival of the paramedics multiple police officers came as well.  With the patient's history of PTSD and time spent in the [employer] he became agitated and combative.  He required sedation and Geodon as well as Haldol on arrival to the emergency department.  Patient is now but in emergency apartment for approximately 18 hours.  The patient's, cooperative.  Has no complaints at this time.  He is alert and oriented x3.  His father remains at the bedside.  His father reports that the patient seems to be doing much better at this time and would like to take the patient home.  The father lives at home with the patient.  The father feels as though the patient is at baseline mental status and feels as though the majority of his agitation last night came after seeing police officers in their attempts at physical the restraining him.  The patient has no homicidal or suicidal thoughts.  He's been compliant with his medications up until Sunday night at which point he missed one dose of all of his medicines.  He does have a psychiatrist.  I've asked the patient followup with the psychiatrist.   Lyanne CoKevin M Illona Bulman, MD 04/01/14 1328

## 2014-04-01 NOTE — ED Notes (Signed)
Pt awake at this time--- no s/s aggressive behavior noted. Restraints released; pt agreed to behave.

## 2014-04-01 NOTE — ED Notes (Signed)
Pt awoke and sts "please let me go".  Dad states pt has been nonverbal all day.  Pt seems more able to respond to commands.   When ask was he hurting, pt sts "no", when ask if he was thirsty pt states "yes" and takes sips of water with writer's assistance.  Pt stated that he had to go to the bathroom and that he would rather his father hold the urinal and not NT.

## 2014-04-01 NOTE — ED Provider Notes (Signed)
CSN: [id number]     Arrival date & time 03/31/14  1933 History   First MD Initiated Contact with Patient 03/31/14 1935     Chief Complaint  Patient presents with  . Aggressive Behavior     (Consider location/radiation/quality/duration/timing/severity/associated sxs/prior Treatment) HPI Comments: He arrives via EMS called by patient's father. Per father, the patient has been not been talking since noon. He had been drinking alcohol but father denied known drug use. The patient became increasingly combative, having blank stare, not responding to questions prompting dad to call for help. He arrives agitated, combative requiring restraint by multiple EMS and [employer] personnel. He does not make eye contact, is not speaking, does not follow commands. Per dad, no vomiting, recent illness, known injury or fever.   The history is provided by the EMS personnel and a parent. No language interpreter was used.    Past Medical History  Diagnosis Date  . Hypertension   . PTSD (post-traumatic stress disorder)   . Anxiety   . Hx of colonic polyps    Past Surgical History  Procedure Laterality Date  . Colonoscopy w/ biopsies and polypectomy    . Myringotomy     No family history on file. History  Substance Use Topics  . Smoking status: Current Every Day Smoker -- 0.50 packs/day for 9 years  . Smokeless tobacco: Never Used  . Alcohol Use: 1.2 oz/week    1 Cans of beer, 1 Shots of liquor per week    Review of Systems  Unable to perform ROS     Allergies  Percocet  Home Medications   Current Outpatient Rx  Name  Route  Sig  Dispense  Refill  . divalproex (DEPAKOTE) 500 MG DR tablet   Oral   Take 500 mg by mouth at bedtime.         Marland Kitchen etodolac (LODINE) 500 MG tablet   Oral   Take 500 mg by mouth 2 (two) times daily.         . hydrOXYzine (VISTARIL) 25 MG capsule   Oral   Take 25 mg by mouth 2 (two) times daily as needed (anxiety).         . methocarbamol (ROBAXIN) 500 MG  tablet   Oral   Take 500 mg by mouth every 8 (eight) hours as needed for muscle spasms (muscle spasm).         . metoprolol (LOPRESSOR) 50 MG tablet   Oral   Take 50 mg by mouth 2 (two) times daily.         Marland Kitchen OLANZapine (ZYPREXA) 20 MG tablet   Oral   Take 30 mg by mouth at bedtime. Take 1.5 tablet at bedtime         . traZODone (DESYREL) 150 MG tablet   Oral   Take by mouth at bedtime as needed for sleep (sleep).         . prazosin (MINIPRESS) 2 MG capsule   Oral   Take 2 mg by mouth at bedtime.           . sertraline (ZOLOFT) 100 MG tablet   Oral   Take 150 mg by mouth daily.           BP 144/88  Pulse 107  Resp 20  SpO2 95% Physical Exam  Constitutional: He appears well-developed and well-nourished. No distress.  HENT:  Head: Atraumatic.  Eyes:  Pupils sluggish to respond but equal.  Cardiovascular: Tachycardia present.   No murmur  heard. Pulmonary/Chest: No respiratory distress. He has no wheezes. He has no rales.  Abdominal: Soft.  Musculoskeletal: Normal range of motion.  Neurological:  Neurologic exam unable to be performed.   Skin: He is diaphoretic.  Psychiatric: He is agitated and combative.    ED Course  Procedures (including critical care time) Labs Review Labs Reviewed  CBC WITH DIFFERENTIAL - Abnormal; Notable for the following:    Neutrophils Relative % 81 (*)    All other components within normal limits  COMPREHENSIVE METABOLIC PANEL - Abnormal; Notable for the following:    Glucose, Bld 155 (*)    Total Bilirubin <0.2 (*)    All other components within normal limits  ETHANOL - Abnormal; Notable for the following:    Alcohol, Ethyl (B) 33 (*)    All other components within normal limits  SALICYLATE LEVEL - Abnormal; Notable for the following:    Salicylate Lvl <2.0 (*)    All other components within normal limits  URINE RAPID DRUG SCREEN (HOSP PERFORMED)  ACETAMINOPHEN LEVEL   Imaging Review No results found.   EKG  Interpretation None      MDM   Final diagnoses:  None    1. Agitated and combative  20:30:  Patient arrives agitated, aggressive, nonverbal, requiring 4-point restraints for his safety and the safety of staff. [employer] at bedside. He is hypertensive, tachycardic, blank stare, remains nonverbal. Haldol given in route with minimal change in behavior. Geodon given in ED. Will continue to monitor.   21:30: He is waking up, again agitated, nonverbal. Additional Geodon ordered. Head CT pending but unable to remove restraints to obtain study. Per father now at bedside, he has been nonverbal since 12:30 this afternoon. He had multiple vomiting episodes early in the afternoon but none since. No known drug use. Will continue to observe.  23:00: He continue to wake periodically, agitated, attempting to get up out of bed. Vital signs improving - blood pressure decreasing, heartrate decreasing. He is no longer diaphoretic.   1:00: Condition largely unchanged. VS improved. He is resting, remains in restraints. Patient care transferred to Dr. Sunnie NielsenBrian Opitz for re-evaluation and disposition.  Arnoldo HookerShari A Dreshon Proffit, PA-C 04/02/14 1616

## 2014-04-03 NOTE — ED Provider Notes (Signed)
Medical screening examination/treatment/procedure(s) were conducted as a shared visit with non-physician practitioner(s) and myself.  I personally evaluated the patient during the encounter.   EKG Interpretation None      Patient here s/p likely drug ingestion. Family stated probably bath salts to EMS. Extremely agitated, unable to cooperate with EKG, Head CT. Geodon given and occasional benzos given to help with agitation. Signed out to Dr. Dierdre Highmanpitz.  Dagmar HaitWilliam Birgit Nowling, MD 04/03/14 [phone]

## 2015-02-15 IMAGING — CT CT HEAD W/O CM
2 series · 16 of 30 positions shown, 20 images · non-contrast
Comparison: None.

CLINICAL DATA: Aggressive behavior.  Combative.

EXAM:
CT HEAD WITHOUT CONTRAST
TECHNIQUE: Contiguous axial images were obtained from the base of the skull
through the vertex without intravenous contrast.

[Series 2: head w/o · axial · non-contrast · 0.48mm/px · z∈[-151,-31]mm · 13 of 29 slices shown, 17 images]
[im 3/29  brain]
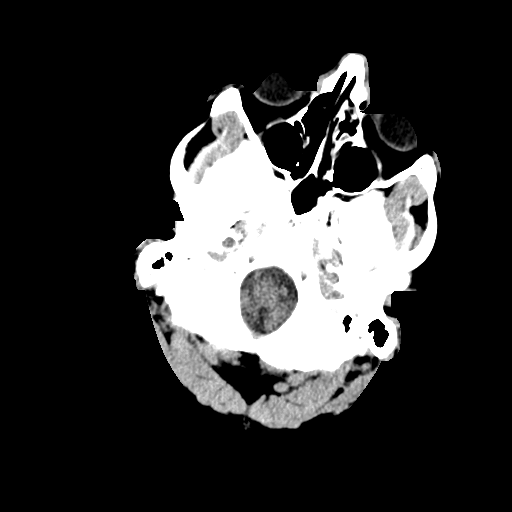
[im 3/29  bone]
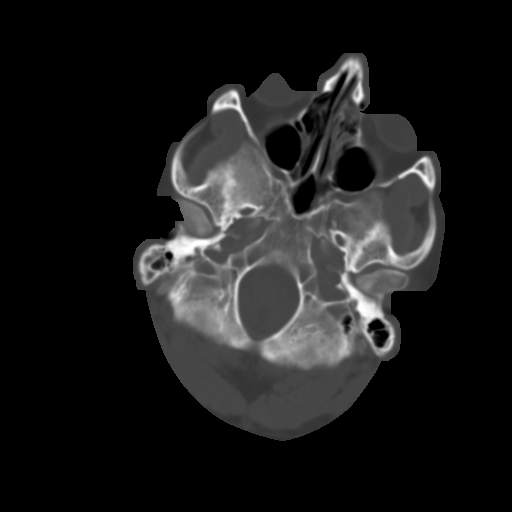
[im 5/29  brain]
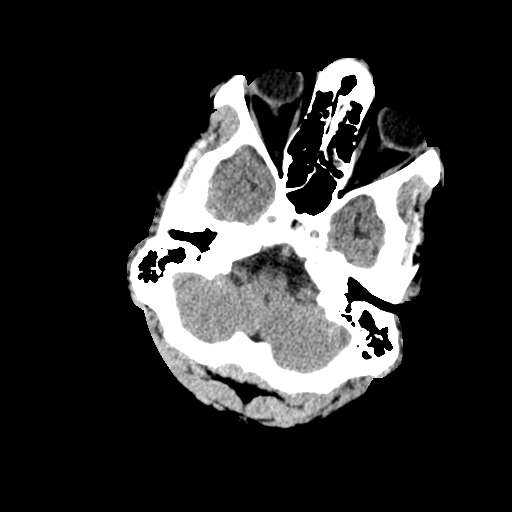
[im 7/29  brain]
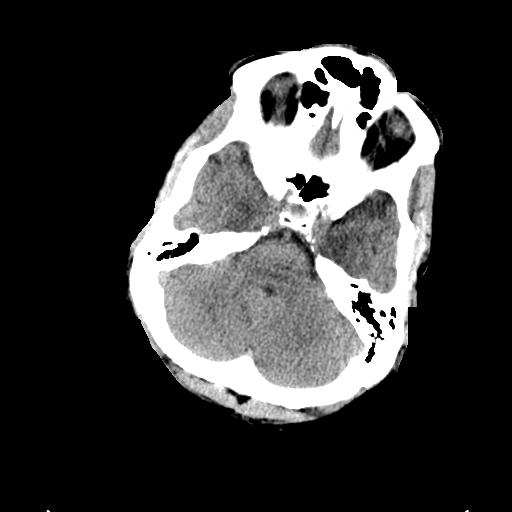
[im 9/29  brain]
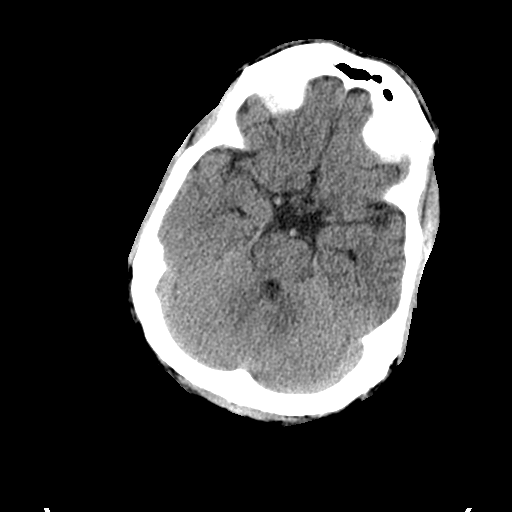
[im 11/29  brain]
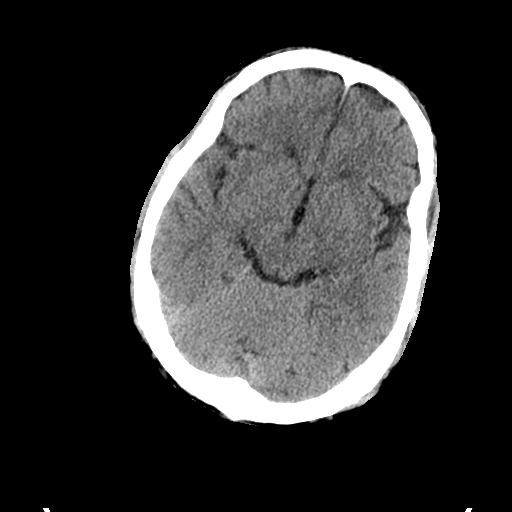
[im 11/29  bone]
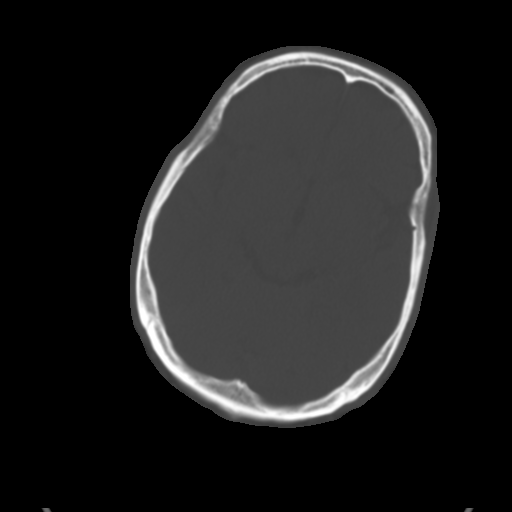
[im 13/29  brain]
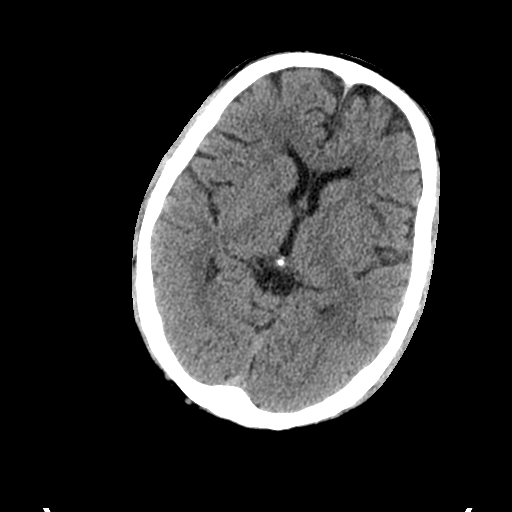
[im 15/29  brain]
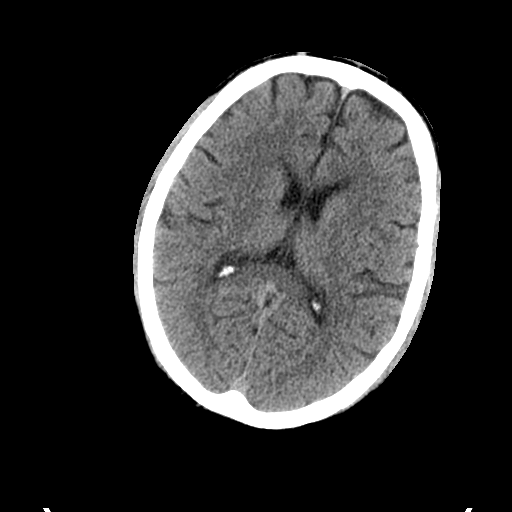
[im 17/29  brain]
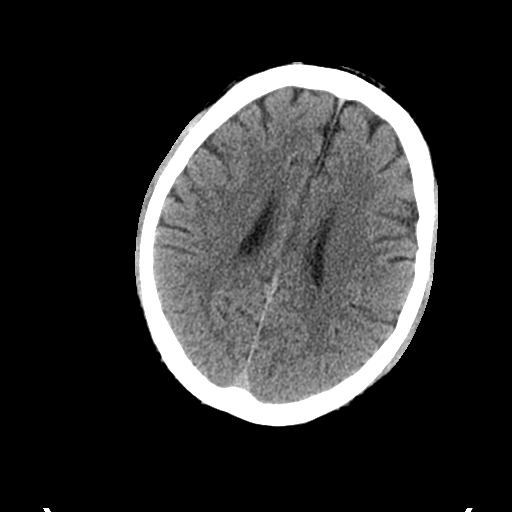
[im 19/29  brain]
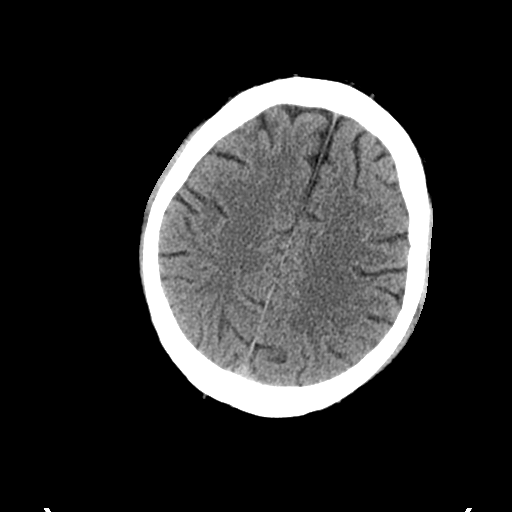
[im 19/29  bone]
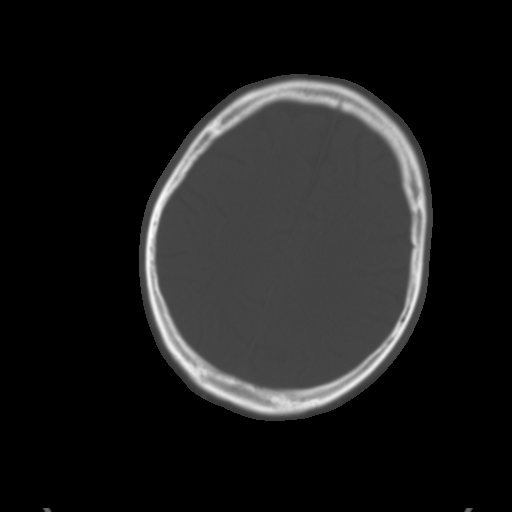
[im 21/29  brain]
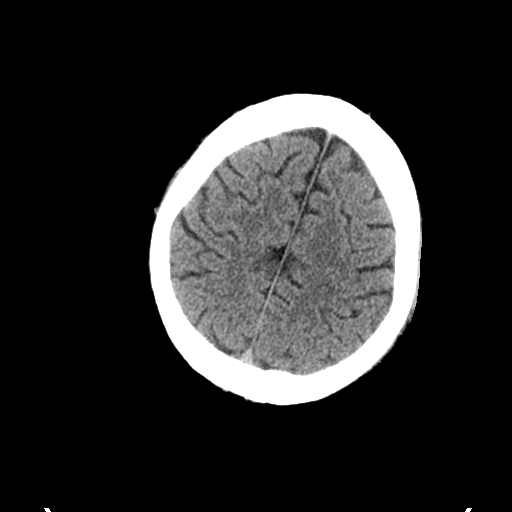
[im 23/29  brain]
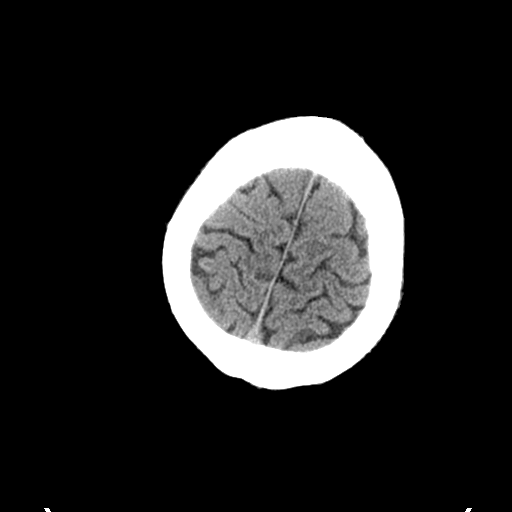
[im 25/29  brain]
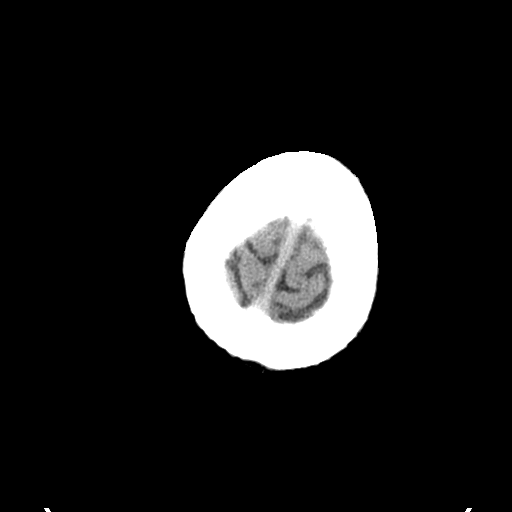
[im 27/29  brain]
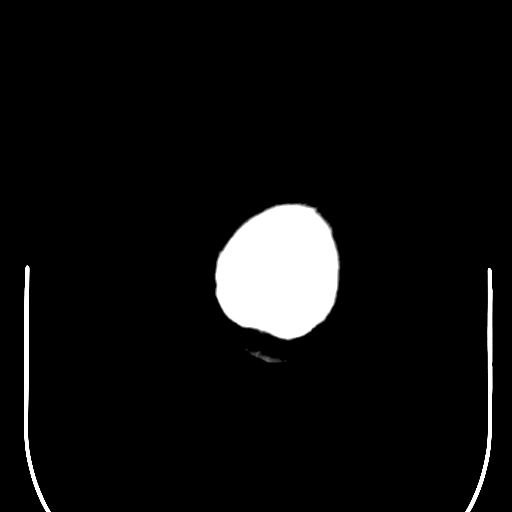
[im 27/29  bone]
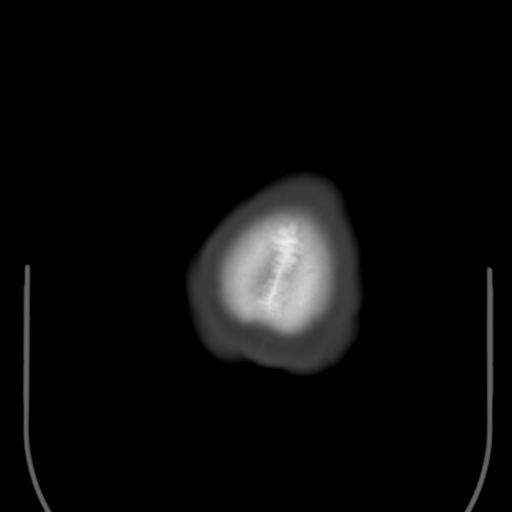

[Series 3: bone windows · axial · 0.48mm/px · z∈[-151,-111]mm · 3 of 29 slices shown]
[im 3/29  bone]
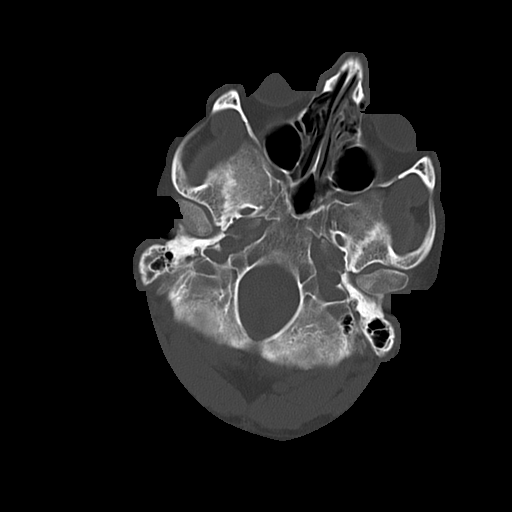
[im 7/29  bone]
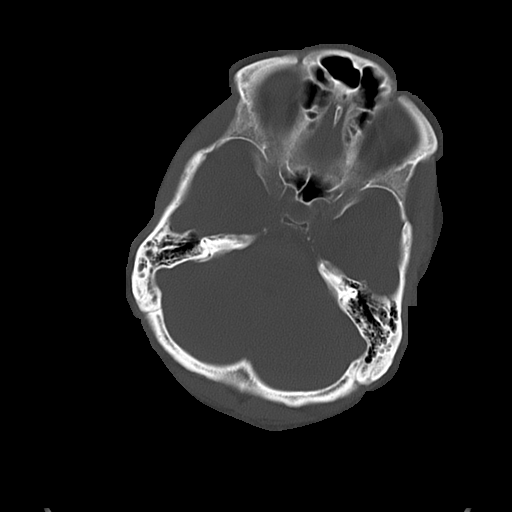
[im 11/29  bone]
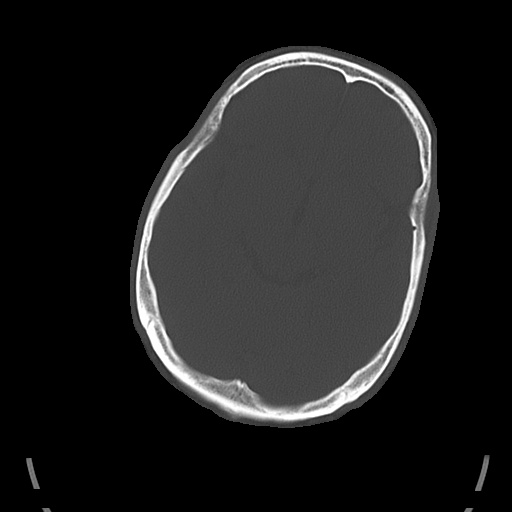

[16 of 30 positions shown; findings below may reference images not displayed]

FINDINGS: No acute cortical infarct, hemorrhage, or mass lesion is present.
The ventricles are of normal size. No significant extra-axial fluid
collection is evident. The paranasal sinuses and mastoid air cells
are clear. The osseous skull is intact.
IMPRESSION: Negative CT of the head.

## 2017-01-26 DEATH — deceased
